# Patient Record
Sex: Female | Born: 1940 | Race: White | Hispanic: No | Marital: Single | Smoking: Never smoker
Health system: Southern US, Community
[De-identification: ages and names within clinical notes are randomized; demographics above are authoritative.]

## PROBLEM LIST (undated history)

## (undated) DIAGNOSIS — E785 Hyperlipidemia, unspecified: Secondary | ICD-10-CM

## (undated) DIAGNOSIS — C801 Malignant (primary) neoplasm, unspecified: Secondary | ICD-10-CM

## (undated) DIAGNOSIS — E039 Hypothyroidism, unspecified: Secondary | ICD-10-CM

## (undated) DIAGNOSIS — M1990 Unspecified osteoarthritis, unspecified site: Secondary | ICD-10-CM

## (undated) DIAGNOSIS — I10 Essential (primary) hypertension: Secondary | ICD-10-CM

## (undated) DIAGNOSIS — R011 Cardiac murmur, unspecified: Secondary | ICD-10-CM

## (undated) HISTORY — DX: Hypothyroidism, unspecified: E03.9

## (undated) HISTORY — PX: VAGINAL HYSTERECTOMY: SUR661

## (undated) HISTORY — DX: Cardiac murmur, unspecified: R01.1

## (undated) HISTORY — DX: Essential (primary) hypertension: I10

## (undated) HISTORY — PX: APPENDECTOMY: SHX54

## (undated) HISTORY — DX: Hyperlipidemia, unspecified: E78.5

## (undated) HISTORY — PX: BREAST SURGERY: SHX581

## (undated) HISTORY — DX: Malignant (primary) neoplasm, unspecified: C80.1

## (undated) HISTORY — DX: Unspecified osteoarthritis, unspecified site: M19.90

## (undated) HISTORY — PX: JOINT REPLACEMENT: SHX530

---

## 2007-02-15 HISTORY — PX: MASTECTOMY, RADICAL: SHX710

## 2010-08-15 HISTORY — PX: COLONOSCOPY: SHX174

## 2011-02-21 DIAGNOSIS — I10 Essential (primary) hypertension: Secondary | ICD-10-CM | POA: Diagnosis not present

## 2011-05-02 DIAGNOSIS — C50319 Malignant neoplasm of lower-inner quadrant of unspecified female breast: Secondary | ICD-10-CM | POA: Diagnosis not present

## 2011-05-02 DIAGNOSIS — C50919 Malignant neoplasm of unspecified site of unspecified female breast: Secondary | ICD-10-CM | POA: Diagnosis not present

## 2011-05-09 DIAGNOSIS — L919 Hypertrophic disorder of the skin, unspecified: Secondary | ICD-10-CM | POA: Diagnosis not present

## 2011-05-09 DIAGNOSIS — L821 Other seborrheic keratosis: Secondary | ICD-10-CM | POA: Diagnosis not present

## 2011-09-05 DIAGNOSIS — C50919 Malignant neoplasm of unspecified site of unspecified female breast: Secondary | ICD-10-CM | POA: Diagnosis not present

## 2011-09-05 DIAGNOSIS — C50319 Malignant neoplasm of lower-inner quadrant of unspecified female breast: Secondary | ICD-10-CM | POA: Diagnosis not present

## 2011-12-05 DIAGNOSIS — Z0000 Encounter for general adult medical examination without abnormal findings: Secondary | ICD-10-CM | POA: Diagnosis not present

## 2011-12-05 DIAGNOSIS — E785 Hyperlipidemia, unspecified: Secondary | ICD-10-CM | POA: Diagnosis not present

## 2011-12-05 DIAGNOSIS — I10 Essential (primary) hypertension: Secondary | ICD-10-CM | POA: Diagnosis not present

## 2011-12-05 DIAGNOSIS — E039 Hypothyroidism, unspecified: Secondary | ICD-10-CM | POA: Diagnosis not present

## 2012-02-20 DIAGNOSIS — Z853 Personal history of malignant neoplasm of breast: Secondary | ICD-10-CM | POA: Diagnosis not present

## 2012-02-20 DIAGNOSIS — Z1231 Encounter for screening mammogram for malignant neoplasm of breast: Secondary | ICD-10-CM | POA: Diagnosis not present

## 2012-02-27 DIAGNOSIS — H52229 Regular astigmatism, unspecified eye: Secondary | ICD-10-CM | POA: Diagnosis not present

## 2012-02-27 DIAGNOSIS — H2510 Age-related nuclear cataract, unspecified eye: Secondary | ICD-10-CM | POA: Diagnosis not present

## 2012-02-27 DIAGNOSIS — H524 Presbyopia: Secondary | ICD-10-CM | POA: Diagnosis not present

## 2012-02-27 DIAGNOSIS — H5210 Myopia, unspecified eye: Secondary | ICD-10-CM | POA: Diagnosis not present

## 2012-04-23 DIAGNOSIS — C50319 Malignant neoplasm of lower-inner quadrant of unspecified female breast: Secondary | ICD-10-CM | POA: Diagnosis not present

## 2012-05-07 DIAGNOSIS — G4700 Insomnia, unspecified: Secondary | ICD-10-CM | POA: Diagnosis not present

## 2012-05-07 DIAGNOSIS — E785 Hyperlipidemia, unspecified: Secondary | ICD-10-CM | POA: Diagnosis not present

## 2012-06-19 DIAGNOSIS — L819 Disorder of pigmentation, unspecified: Secondary | ICD-10-CM | POA: Diagnosis not present

## 2012-06-19 DIAGNOSIS — D235 Other benign neoplasm of skin of trunk: Secondary | ICD-10-CM | POA: Diagnosis not present

## 2012-06-19 DIAGNOSIS — L578 Other skin changes due to chronic exposure to nonionizing radiation: Secondary | ICD-10-CM | POA: Diagnosis not present

## 2012-06-19 DIAGNOSIS — L821 Other seborrheic keratosis: Secondary | ICD-10-CM | POA: Diagnosis not present

## 2012-09-24 DIAGNOSIS — R1013 Epigastric pain: Secondary | ICD-10-CM | POA: Diagnosis not present

## 2012-10-01 DIAGNOSIS — K7689 Other specified diseases of liver: Secondary | ICD-10-CM | POA: Diagnosis not present

## 2012-10-01 DIAGNOSIS — R1013 Epigastric pain: Secondary | ICD-10-CM | POA: Diagnosis not present

## 2012-10-13 DIAGNOSIS — M25579 Pain in unspecified ankle and joints of unspecified foot: Secondary | ICD-10-CM | POA: Diagnosis not present

## 2012-11-19 DIAGNOSIS — C50919 Malignant neoplasm of unspecified site of unspecified female breast: Secondary | ICD-10-CM | POA: Diagnosis not present

## 2012-11-19 DIAGNOSIS — C50319 Malignant neoplasm of lower-inner quadrant of unspecified female breast: Secondary | ICD-10-CM | POA: Diagnosis not present

## 2012-12-10 DIAGNOSIS — I43 Cardiomyopathy in diseases classified elsewhere: Secondary | ICD-10-CM | POA: Diagnosis not present

## 2012-12-10 DIAGNOSIS — Z23 Encounter for immunization: Secondary | ICD-10-CM | POA: Diagnosis not present

## 2012-12-10 DIAGNOSIS — E039 Hypothyroidism, unspecified: Secondary | ICD-10-CM | POA: Diagnosis not present

## 2012-12-10 DIAGNOSIS — I10 Essential (primary) hypertension: Secondary | ICD-10-CM | POA: Diagnosis not present

## 2012-12-10 DIAGNOSIS — Z0000 Encounter for general adult medical examination without abnormal findings: Secondary | ICD-10-CM | POA: Diagnosis not present

## 2012-12-10 DIAGNOSIS — E785 Hyperlipidemia, unspecified: Secondary | ICD-10-CM | POA: Diagnosis not present

## 2013-04-15 DIAGNOSIS — Z1231 Encounter for screening mammogram for malignant neoplasm of breast: Secondary | ICD-10-CM | POA: Diagnosis not present

## 2013-04-15 DIAGNOSIS — Z853 Personal history of malignant neoplasm of breast: Secondary | ICD-10-CM | POA: Diagnosis not present

## 2013-05-06 DIAGNOSIS — H5210 Myopia, unspecified eye: Secondary | ICD-10-CM | POA: Diagnosis not present

## 2013-05-06 DIAGNOSIS — H52229 Regular astigmatism, unspecified eye: Secondary | ICD-10-CM | POA: Diagnosis not present

## 2013-05-06 DIAGNOSIS — H2510 Age-related nuclear cataract, unspecified eye: Secondary | ICD-10-CM | POA: Diagnosis not present

## 2013-05-06 DIAGNOSIS — H524 Presbyopia: Secondary | ICD-10-CM | POA: Diagnosis not present

## 2013-05-20 DIAGNOSIS — C50419 Malignant neoplasm of upper-outer quadrant of unspecified female breast: Secondary | ICD-10-CM | POA: Diagnosis not present

## 2013-05-20 DIAGNOSIS — C50319 Malignant neoplasm of lower-inner quadrant of unspecified female breast: Secondary | ICD-10-CM | POA: Diagnosis not present

## 2013-05-20 DIAGNOSIS — C50919 Malignant neoplasm of unspecified site of unspecified female breast: Secondary | ICD-10-CM | POA: Diagnosis not present

## 2013-05-27 DIAGNOSIS — Z23 Encounter for immunization: Secondary | ICD-10-CM | POA: Diagnosis not present

## 2013-07-29 DIAGNOSIS — I10 Essential (primary) hypertension: Secondary | ICD-10-CM | POA: Diagnosis not present

## 2013-07-29 DIAGNOSIS — H8309 Labyrinthitis, unspecified ear: Secondary | ICD-10-CM | POA: Diagnosis not present

## 2013-12-23 DIAGNOSIS — E784 Other hyperlipidemia: Secondary | ICD-10-CM | POA: Diagnosis not present

## 2013-12-23 DIAGNOSIS — I10 Essential (primary) hypertension: Secondary | ICD-10-CM | POA: Diagnosis not present

## 2013-12-23 DIAGNOSIS — Z0000 Encounter for general adult medical examination without abnormal findings: Secondary | ICD-10-CM | POA: Diagnosis not present

## 2013-12-23 DIAGNOSIS — C50919 Malignant neoplasm of unspecified site of unspecified female breast: Secondary | ICD-10-CM | POA: Diagnosis not present

## 2013-12-23 DIAGNOSIS — Z23 Encounter for immunization: Secondary | ICD-10-CM | POA: Diagnosis not present

## 2013-12-23 DIAGNOSIS — G4700 Insomnia, unspecified: Secondary | ICD-10-CM | POA: Diagnosis not present

## 2013-12-23 DIAGNOSIS — I43 Cardiomyopathy in diseases classified elsewhere: Secondary | ICD-10-CM | POA: Diagnosis not present

## 2013-12-23 DIAGNOSIS — E039 Hypothyroidism, unspecified: Secondary | ICD-10-CM | POA: Diagnosis not present

## 2014-02-14 HISTORY — PX: SHOULDER SURGERY: SHX246

## 2014-04-15 HISTORY — PX: BREAST EXCISIONAL BIOPSY: SUR124

## 2014-04-28 DIAGNOSIS — Z1231 Encounter for screening mammogram for malignant neoplasm of breast: Secondary | ICD-10-CM | POA: Diagnosis not present

## 2014-04-28 DIAGNOSIS — Z9012 Acquired absence of left breast and nipple: Secondary | ICD-10-CM | POA: Diagnosis not present

## 2014-04-28 DIAGNOSIS — Z853 Personal history of malignant neoplasm of breast: Secondary | ICD-10-CM | POA: Diagnosis not present

## 2014-05-02 DIAGNOSIS — N63 Unspecified lump in breast: Secondary | ICD-10-CM | POA: Diagnosis not present

## 2014-05-12 DIAGNOSIS — R928 Other abnormal and inconclusive findings on diagnostic imaging of breast: Secondary | ICD-10-CM | POA: Diagnosis not present

## 2014-05-12 DIAGNOSIS — L03317 Cellulitis of buttock: Secondary | ICD-10-CM | POA: Diagnosis not present

## 2014-05-12 DIAGNOSIS — I10 Essential (primary) hypertension: Secondary | ICD-10-CM | POA: Diagnosis not present

## 2014-05-13 DIAGNOSIS — D4861 Neoplasm of uncertain behavior of right breast: Secondary | ICD-10-CM | POA: Diagnosis not present

## 2014-05-13 DIAGNOSIS — N63 Unspecified lump in breast: Secondary | ICD-10-CM | POA: Diagnosis not present

## 2014-05-13 DIAGNOSIS — Z853 Personal history of malignant neoplasm of breast: Secondary | ICD-10-CM | POA: Diagnosis not present

## 2014-05-13 DIAGNOSIS — D241 Benign neoplasm of right breast: Secondary | ICD-10-CM | POA: Diagnosis not present

## 2014-06-04 DIAGNOSIS — L918 Other hypertrophic disorders of the skin: Secondary | ICD-10-CM | POA: Diagnosis not present

## 2014-06-23 DIAGNOSIS — Z853 Personal history of malignant neoplasm of breast: Secondary | ICD-10-CM | POA: Diagnosis not present

## 2014-06-23 DIAGNOSIS — E039 Hypothyroidism, unspecified: Secondary | ICD-10-CM | POA: Diagnosis not present

## 2014-06-23 DIAGNOSIS — D241 Benign neoplasm of right breast: Secondary | ICD-10-CM | POA: Diagnosis not present

## 2014-06-23 DIAGNOSIS — K5120 Ulcerative (chronic) proctitis without complications: Secondary | ICD-10-CM | POA: Diagnosis not present

## 2014-06-23 DIAGNOSIS — N6091 Unspecified benign mammary dysplasia of right breast: Secondary | ICD-10-CM | POA: Diagnosis not present

## 2014-06-23 DIAGNOSIS — N649 Disorder of breast, unspecified: Secondary | ICD-10-CM | POA: Diagnosis not present

## 2014-06-23 DIAGNOSIS — N6489 Other specified disorders of breast: Secondary | ICD-10-CM | POA: Diagnosis not present

## 2014-06-23 DIAGNOSIS — R928 Other abnormal and inconclusive findings on diagnostic imaging of breast: Secondary | ICD-10-CM | POA: Diagnosis not present

## 2014-06-23 DIAGNOSIS — I10 Essential (primary) hypertension: Secondary | ICD-10-CM | POA: Diagnosis not present

## 2014-06-23 DIAGNOSIS — Z79899 Other long term (current) drug therapy: Secondary | ICD-10-CM | POA: Diagnosis not present

## 2014-06-25 DIAGNOSIS — D225 Melanocytic nevi of trunk: Secondary | ICD-10-CM | POA: Diagnosis not present

## 2014-06-25 DIAGNOSIS — L821 Other seborrheic keratosis: Secondary | ICD-10-CM | POA: Diagnosis not present

## 2014-06-25 DIAGNOSIS — D2372 Other benign neoplasm of skin of left lower limb, including hip: Secondary | ICD-10-CM | POA: Diagnosis not present

## 2014-06-25 DIAGNOSIS — L570 Actinic keratosis: Secondary | ICD-10-CM | POA: Diagnosis not present

## 2014-06-25 DIAGNOSIS — L578 Other skin changes due to chronic exposure to nonionizing radiation: Secondary | ICD-10-CM | POA: Diagnosis not present

## 2014-06-27 DIAGNOSIS — Z853 Personal history of malignant neoplasm of breast: Secondary | ICD-10-CM | POA: Insufficient documentation

## 2014-06-27 DIAGNOSIS — E039 Hypothyroidism, unspecified: Secondary | ICD-10-CM | POA: Insufficient documentation

## 2014-06-30 DIAGNOSIS — Z800 Family history of malignant neoplasm of digestive organs: Secondary | ICD-10-CM | POA: Diagnosis not present

## 2014-06-30 DIAGNOSIS — R140 Abdominal distension (gaseous): Secondary | ICD-10-CM | POA: Diagnosis not present

## 2014-07-02 DIAGNOSIS — L918 Other hypertrophic disorders of the skin: Secondary | ICD-10-CM | POA: Diagnosis not present

## 2014-08-05 DIAGNOSIS — M25512 Pain in left shoulder: Secondary | ICD-10-CM | POA: Diagnosis not present

## 2014-08-26 ENCOUNTER — Other Ambulatory Visit: Payer: Self-pay | Admitting: Internal Medicine

## 2014-09-01 DIAGNOSIS — M75112 Incomplete rotator cuff tear or rupture of left shoulder, not specified as traumatic: Secondary | ICD-10-CM | POA: Diagnosis not present

## 2014-09-01 DIAGNOSIS — M25512 Pain in left shoulder: Secondary | ICD-10-CM | POA: Diagnosis not present

## 2014-09-08 ENCOUNTER — Other Ambulatory Visit: Payer: Self-pay | Admitting: Internal Medicine

## 2014-09-09 DIAGNOSIS — M75112 Incomplete rotator cuff tear or rupture of left shoulder, not specified as traumatic: Secondary | ICD-10-CM | POA: Diagnosis not present

## 2014-09-09 DIAGNOSIS — M25512 Pain in left shoulder: Secondary | ICD-10-CM | POA: Diagnosis not present

## 2014-09-30 ENCOUNTER — Other Ambulatory Visit: Payer: Self-pay | Admitting: Internal Medicine

## 2014-10-07 DIAGNOSIS — M25512 Pain in left shoulder: Secondary | ICD-10-CM | POA: Diagnosis not present

## 2014-10-21 DIAGNOSIS — M8700 Idiopathic aseptic necrosis of unspecified bone: Secondary | ICD-10-CM | POA: Diagnosis not present

## 2014-10-27 DIAGNOSIS — Z853 Personal history of malignant neoplasm of breast: Secondary | ICD-10-CM | POA: Diagnosis not present

## 2014-10-27 DIAGNOSIS — M8700 Idiopathic aseptic necrosis of unspecified bone: Secondary | ICD-10-CM | POA: Diagnosis not present

## 2014-10-27 DIAGNOSIS — M25512 Pain in left shoulder: Secondary | ICD-10-CM | POA: Diagnosis not present

## 2014-10-29 ENCOUNTER — Other Ambulatory Visit: Payer: Self-pay | Admitting: Internal Medicine

## 2014-10-30 ENCOUNTER — Other Ambulatory Visit: Payer: Self-pay | Admitting: Internal Medicine

## 2014-10-30 ENCOUNTER — Encounter: Payer: Self-pay | Admitting: Internal Medicine

## 2014-10-30 ENCOUNTER — Other Ambulatory Visit: Payer: Self-pay

## 2014-10-30 MED ORDER — ZOLPIDEM TARTRATE 10 MG PO TABS: 10.0000 mg | ORAL_TABLET | Freq: Every day | ORAL | Status: DC

## 2014-11-02 ENCOUNTER — Other Ambulatory Visit: Payer: Self-pay | Admitting: Internal Medicine

## 2014-11-03 ENCOUNTER — Other Ambulatory Visit: Payer: Self-pay | Admitting: Internal Medicine

## 2014-11-03 DIAGNOSIS — H524 Presbyopia: Secondary | ICD-10-CM | POA: Diagnosis not present

## 2014-11-03 DIAGNOSIS — H2513 Age-related nuclear cataract, bilateral: Secondary | ICD-10-CM | POA: Diagnosis not present

## 2014-11-03 DIAGNOSIS — H5213 Myopia, bilateral: Secondary | ICD-10-CM | POA: Diagnosis not present

## 2014-11-03 DIAGNOSIS — H52223 Regular astigmatism, bilateral: Secondary | ICD-10-CM | POA: Diagnosis not present

## 2014-11-03 MED ORDER — ZOLPIDEM TARTRATE 10 MG PO TABS: 10.0000 mg | ORAL_TABLET | Freq: Every day | ORAL | Status: DC

## 2014-11-04 DIAGNOSIS — M8700 Idiopathic aseptic necrosis of unspecified bone: Secondary | ICD-10-CM | POA: Diagnosis not present

## 2014-11-12 DIAGNOSIS — M8700 Idiopathic aseptic necrosis of unspecified bone: Secondary | ICD-10-CM | POA: Diagnosis not present

## 2014-11-26 DIAGNOSIS — M75112 Incomplete rotator cuff tear or rupture of left shoulder, not specified as traumatic: Secondary | ICD-10-CM | POA: Diagnosis not present

## 2014-11-26 DIAGNOSIS — M7542 Impingement syndrome of left shoulder: Secondary | ICD-10-CM | POA: Diagnosis not present

## 2014-11-26 DIAGNOSIS — M87822 Other osteonecrosis, left humerus: Secondary | ICD-10-CM | POA: Diagnosis not present

## 2014-11-26 DIAGNOSIS — M7502 Adhesive capsulitis of left shoulder: Secondary | ICD-10-CM | POA: Diagnosis not present

## 2014-11-26 DIAGNOSIS — M89722 Major osseous defect, left humerus: Secondary | ICD-10-CM | POA: Diagnosis not present

## 2014-11-26 DIAGNOSIS — Z853 Personal history of malignant neoplasm of breast: Secondary | ICD-10-CM | POA: Diagnosis not present

## 2014-11-26 DIAGNOSIS — M19012 Primary osteoarthritis, left shoulder: Secondary | ICD-10-CM | POA: Diagnosis not present

## 2014-11-26 DIAGNOSIS — M75102 Unspecified rotator cuff tear or rupture of left shoulder, not specified as traumatic: Secondary | ICD-10-CM | POA: Diagnosis not present

## 2014-11-26 DIAGNOSIS — M7552 Bursitis of left shoulder: Secondary | ICD-10-CM | POA: Diagnosis not present

## 2014-11-26 DIAGNOSIS — M87022 Idiopathic aseptic necrosis of left humerus: Secondary | ICD-10-CM | POA: Diagnosis not present

## 2014-12-02 DIAGNOSIS — G8929 Other chronic pain: Secondary | ICD-10-CM | POA: Diagnosis not present

## 2014-12-02 DIAGNOSIS — Z4889 Encounter for other specified surgical aftercare: Secondary | ICD-10-CM | POA: Diagnosis not present

## 2014-12-02 DIAGNOSIS — M25511 Pain in right shoulder: Secondary | ICD-10-CM | POA: Diagnosis not present

## 2014-12-02 DIAGNOSIS — Z4789 Encounter for other orthopedic aftercare: Secondary | ICD-10-CM | POA: Diagnosis not present

## 2014-12-03 ENCOUNTER — Encounter: Payer: Self-pay | Admitting: Internal Medicine

## 2014-12-03 DIAGNOSIS — R928 Other abnormal and inconclusive findings on diagnostic imaging of breast: Secondary | ICD-10-CM | POA: Insufficient documentation

## 2014-12-03 DIAGNOSIS — I10 Essential (primary) hypertension: Secondary | ICD-10-CM | POA: Insufficient documentation

## 2014-12-03 DIAGNOSIS — M1711 Unilateral primary osteoarthritis, right knee: Secondary | ICD-10-CM | POA: Insufficient documentation

## 2014-12-03 DIAGNOSIS — Z853 Personal history of malignant neoplasm of breast: Secondary | ICD-10-CM | POA: Insufficient documentation

## 2014-12-03 DIAGNOSIS — F5101 Primary insomnia: Secondary | ICD-10-CM | POA: Insufficient documentation

## 2014-12-03 DIAGNOSIS — E559 Vitamin D deficiency, unspecified: Secondary | ICD-10-CM | POA: Insufficient documentation

## 2014-12-03 DIAGNOSIS — E039 Hypothyroidism, unspecified: Secondary | ICD-10-CM | POA: Insufficient documentation

## 2014-12-03 DIAGNOSIS — K5010 Crohn's disease of large intestine without complications: Secondary | ICD-10-CM | POA: Insufficient documentation

## 2014-12-03 DIAGNOSIS — E785 Hyperlipidemia, unspecified: Secondary | ICD-10-CM | POA: Insufficient documentation

## 2014-12-03 DIAGNOSIS — L719 Rosacea, unspecified: Secondary | ICD-10-CM | POA: Insufficient documentation

## 2014-12-08 DIAGNOSIS — G8929 Other chronic pain: Secondary | ICD-10-CM | POA: Diagnosis not present

## 2014-12-08 DIAGNOSIS — M25511 Pain in right shoulder: Secondary | ICD-10-CM | POA: Diagnosis not present

## 2014-12-08 DIAGNOSIS — Z4889 Encounter for other specified surgical aftercare: Secondary | ICD-10-CM | POA: Diagnosis not present

## 2014-12-08 DIAGNOSIS — Z4789 Encounter for other orthopedic aftercare: Secondary | ICD-10-CM | POA: Diagnosis not present

## 2014-12-15 ENCOUNTER — Encounter: Payer: Self-pay | Admitting: Internal Medicine

## 2014-12-15 ENCOUNTER — Ambulatory Visit (INDEPENDENT_AMBULATORY_CARE_PROVIDER_SITE_OTHER): Payer: Medicare Other | Admitting: Internal Medicine

## 2014-12-15 VITALS — BP 110/68 | HR 60 | Ht 64.5 in | Wt 171.2 lb

## 2014-12-15 DIAGNOSIS — I10 Essential (primary) hypertension: Secondary | ICD-10-CM | POA: Diagnosis not present

## 2014-12-15 DIAGNOSIS — M8700 Idiopathic aseptic necrosis of unspecified bone: Secondary | ICD-10-CM

## 2014-12-15 DIAGNOSIS — B009 Herpesviral infection, unspecified: Secondary | ICD-10-CM | POA: Insufficient documentation

## 2014-12-15 DIAGNOSIS — Z23 Encounter for immunization: Secondary | ICD-10-CM

## 2014-12-15 DIAGNOSIS — M25511 Pain in right shoulder: Secondary | ICD-10-CM | POA: Diagnosis not present

## 2014-12-15 DIAGNOSIS — Z4889 Encounter for other specified surgical aftercare: Secondary | ICD-10-CM | POA: Diagnosis not present

## 2014-12-15 DIAGNOSIS — G8929 Other chronic pain: Secondary | ICD-10-CM | POA: Diagnosis not present

## 2014-12-15 DIAGNOSIS — Z4789 Encounter for other orthopedic aftercare: Secondary | ICD-10-CM | POA: Diagnosis not present

## 2014-12-15 MED ORDER — VALACYCLOVIR HCL 1 G PO TABS: 1000.0000 mg | ORAL_TABLET | Freq: Two times a day (BID) | ORAL | Status: DC

## 2014-12-15 NOTE — Progress Notes (Signed)
Date:  12/15/2014   Name:  Belinda Sanchez   DOB:  01-02-1941   MRN:  [id number]   Chief Complaint: Rash and Hypertension Rash This is a recurrent problem. The affected locations include the right buttock. The rash is characterized by blistering, itchiness, redness and burning. Pertinent negatives include no cough, fever or shortness of breath. Past treatments include nothing. (No history of herpes infection. The rash occurs in the same spot usually last 7-10 days.)  Hypertension This is a chronic problem. The current episode started more than 1 year ago. The problem is controlled. Pertinent negatives include no chest pain, headaches, palpitations or shortness of breath. There are no associated agents to hypertension. Past treatments include calcium channel blockers. The current treatment provides significant improvement. There are no compliance problems.    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 She is doing better no longer taking narcotic pain medication. She is undergoing physical therapy.  Review of Systems  Constitutional: Negative for fever and chills.  Respiratory: Negative for cough, chest tightness and shortness of breath.   Cardiovascular: Negative for chest pain, palpitations and leg swelling.  Genitourinary: Negative for dysuria and genital sores.  Musculoskeletal: Positive for arthralgias (Recent left shoulder surgery).  Skin: Positive for rash.  Neurological: Negative for weakness, numbness and headaches.  Hematological: Negative for adenopathy.  Psychiatric/Behavioral: Negative for dysphoric mood. The patient is not nervous/anxious.     Patient Active Problem List   Diagnosis Date Noted  . Abnormal finding on mammography 12/03/2014  . Acne erythematosa 12/03/2014  . Acquired hypothyroidism 12/03/2014  . CC (Crohn's colitis) ([place]) 12/03/2014  . Dyslipidemia 12/03/2014  . Essential (primary) hypertension 12/03/2014  .  Idiopathic insomnia 12/03/2014  . Breast CA ([place]) 12/03/2014  . 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 Avitaminosis D 12/03/2014  . 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 Authorizing Provider  BYSTOLIC 10 MG tablet TAKE ONE TABLET BY MOUTH EVERY DAY 11/02/14  Yes Glean Hess, MD  diltiazem (DILACOR XR) 240 MG 24 hr capsule TAKE ONE CAPSULE BY MOUTH DAILY 10/01/14  Yes Glean Hess, MD  Flaxseed, Linseed, (FLAXSEED OIL) 1000 MG CAPS Take by mouth.   Yes Historical Provider, MD  levothyroxine (SYNTHROID) 150 MCG tablet Take by mouth. 02/12/14  Yes Historical Provider, MD  losartan (COZAAR) 50 MG tablet TAKE 1 TABLET BY MOUTH EVERY DAY 08/27/14  Yes Glean Hess, MD  Multiple Vitamins tablet Take by mouth.   Yes Historical Provider, MD  Omega-3 Fatty Acids (FISH OIL) 1000 MG CPDR Take by mouth.   Yes Historical Provider, MD  RA KRILL OIL 500 MG CAPS Take by mouth.   Yes Historical Provider, MD  zolpidem (AMBIEN) 10 MG tablet Take 1 tablet (10 mg total) by mouth at bedtime. 11/03/14  Yes Glean Hess, MD    Allergies  Allergen Reactions  . Lozol  [Indapamide]     elevated serum calcium  . Loratadine Palpitations    Past Surgical History  Procedure Laterality Date  . Mastectomy, radical Left 2009  . Vaginal hysterectomy      total  . Appendectomy    . Shoulder surgery  2016    AVN    Social History  Substance Use Topics  . Smoking status: Never Smoker   . Smokeless tobacco: None  . Alcohol Use: 1.2 oz/week    2 Standard drinks  or equivalent per week    Medication list has been reviewed and updated.   Physical Exam  Constitutional: She is oriented to person, place, and time. She appears well-developed. No distress.  HENT:  Head: Normocephalic and atraumatic.  Eyes: Conjunctivae are normal. Right eye exhibits no discharge. Left eye exhibits no discharge. No scleral icterus.    Cardiovascular: Normal rate, regular rhythm and normal heart sounds.   Pulmonary/Chest: Effort normal and breath sounds normal. No respiratory distress.  Musculoskeletal: Normal range of motion.  Neurological: She is alert and oriented to person, place, and time.  Skin: Skin is warm and dry. No rash noted.     Psychiatric: She has a normal mood and affect. Her behavior is normal. Thought content normal.    BP 110/68 mmHg  Pulse 60  Ht 5' 4.5" (1.638 m)  Wt 171 lb 3.2 oz (77.656 kg)  BMI 28.94 kg/m2  Assessment and Plan: 1. Flu vaccine need - Flu Vaccine QUAD 36+ mos PF IM (Fluarix & Fluzone Quad PF)  2. Avascular necrosis of bone (HCC) Status post shoulder surgery doing well  3. Essential (primary) hypertension Controlled on current regimen  4. 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

## 2014-12-23 DIAGNOSIS — Z4789 Encounter for other orthopedic aftercare: Secondary | ICD-10-CM | POA: Diagnosis not present

## 2014-12-23 DIAGNOSIS — M25511 Pain in right shoulder: Secondary | ICD-10-CM | POA: Diagnosis not present

## 2014-12-23 DIAGNOSIS — Z4889 Encounter for other specified surgical aftercare: Secondary | ICD-10-CM | POA: Diagnosis not present

## 2014-12-23 DIAGNOSIS — G8929 Other chronic pain: Secondary | ICD-10-CM | POA: Diagnosis not present

## 2015-01-01 DIAGNOSIS — Z4889 Encounter for other specified surgical aftercare: Secondary | ICD-10-CM | POA: Diagnosis not present

## 2015-01-01 DIAGNOSIS — Z4789 Encounter for other orthopedic aftercare: Secondary | ICD-10-CM | POA: Diagnosis not present

## 2015-01-01 DIAGNOSIS — M25511 Pain in right shoulder: Secondary | ICD-10-CM | POA: Diagnosis not present

## 2015-01-01 DIAGNOSIS — G8929 Other chronic pain: Secondary | ICD-10-CM | POA: Diagnosis not present

## 2015-01-12 DIAGNOSIS — L821 Other seborrheic keratosis: Secondary | ICD-10-CM | POA: Diagnosis not present

## 2015-01-13 DIAGNOSIS — Z4889 Encounter for other specified surgical aftercare: Secondary | ICD-10-CM | POA: Diagnosis not present

## 2015-01-13 DIAGNOSIS — M25511 Pain in right shoulder: Secondary | ICD-10-CM | POA: Diagnosis not present

## 2015-01-13 DIAGNOSIS — Z4789 Encounter for other orthopedic aftercare: Secondary | ICD-10-CM | POA: Diagnosis not present

## 2015-01-13 DIAGNOSIS — G8929 Other chronic pain: Secondary | ICD-10-CM | POA: Diagnosis not present

## 2015-01-15 ENCOUNTER — Other Ambulatory Visit: Payer: Self-pay | Admitting: Internal Medicine

## 2015-01-27 DIAGNOSIS — M25511 Pain in right shoulder: Secondary | ICD-10-CM | POA: Diagnosis not present

## 2015-01-27 DIAGNOSIS — G8929 Other chronic pain: Secondary | ICD-10-CM | POA: Diagnosis not present

## 2015-01-27 DIAGNOSIS — Z4789 Encounter for other orthopedic aftercare: Secondary | ICD-10-CM | POA: Diagnosis not present

## 2015-01-27 DIAGNOSIS — Z4889 Encounter for other specified surgical aftercare: Secondary | ICD-10-CM | POA: Diagnosis not present

## 2015-02-02 DIAGNOSIS — M25511 Pain in right shoulder: Secondary | ICD-10-CM | POA: Diagnosis not present

## 2015-02-02 DIAGNOSIS — Z4789 Encounter for other orthopedic aftercare: Secondary | ICD-10-CM | POA: Diagnosis not present

## 2015-02-02 DIAGNOSIS — Z4889 Encounter for other specified surgical aftercare: Secondary | ICD-10-CM | POA: Diagnosis not present

## 2015-02-02 DIAGNOSIS — G8929 Other chronic pain: Secondary | ICD-10-CM | POA: Diagnosis not present

## 2015-03-02 ENCOUNTER — Encounter: Payer: Self-pay | Admitting: Internal Medicine

## 2015-03-02 ENCOUNTER — Ambulatory Visit (INDEPENDENT_AMBULATORY_CARE_PROVIDER_SITE_OTHER): Payer: Medicare Other | Admitting: Internal Medicine

## 2015-03-02 VITALS — BP 138/88 | HR 52 | Ht 64.5 in | Wt 174.6 lb

## 2015-03-02 DIAGNOSIS — Z0000 Encounter for general adult medical examination without abnormal findings: Secondary | ICD-10-CM

## 2015-03-02 DIAGNOSIS — Z853 Personal history of malignant neoplasm of breast: Secondary | ICD-10-CM

## 2015-03-02 DIAGNOSIS — E785 Hyperlipidemia, unspecified: Secondary | ICD-10-CM

## 2015-03-02 DIAGNOSIS — I10 Essential (primary) hypertension: Secondary | ICD-10-CM | POA: Diagnosis not present

## 2015-03-02 DIAGNOSIS — K5010 Crohn's disease of large intestine without complications: Secondary | ICD-10-CM

## 2015-03-02 DIAGNOSIS — E039 Hypothyroidism, unspecified: Secondary | ICD-10-CM | POA: Diagnosis not present

## 2015-03-02 LAB — POCT URINALYSIS DIPSTICK
Bilirubin, UA: NEGATIVE
Blood, UA: NEGATIVE
Glucose, UA: NEGATIVE
KETONES UA: NEGATIVE
LEUKOCYTES UA: NEGATIVE
NITRITE UA: NEGATIVE
PH UA: 7.5
PROTEIN UA: NEGATIVE
Spec Grav, UA: 1.01
UROBILINOGEN UA: 0.2

## 2015-03-02 NOTE — Progress Notes (Signed)
Patient: Belinda Sanchez, Female    DOB: 1940/08/20, 75 y.o.   MRN: [id number] Visit Date: 03/02/2015  Today's Provider: Halina Maidens, MD   Chief Complaint  Patient presents with  . Medicare Wellness   Subjective:    Annual wellness visit Belinda Sanchez is a 75 y.o. female who presents today for her Subsequent Annual Wellness Visit. She feels well. She reports exercising walking. She reports she is sleeping well using ambien.  Last mammogram was 04/2014 followed by right breast mass excision which was benign.  She is unsure her next follow up. She denies any breast problems. She is recovering well from left shoulder replacement for AVN. ----------------------------------------------------------- Hypertension This is a chronic problem. The current episode started more than 1 year ago. The problem is unchanged. The problem is controlled. Pertinent negatives include no chest pain, headaches or palpitations. Past treatments include beta blockers, calcium channel blockers and angiotensin blockers. The current treatment provides significant improvement. There are no compliance problems.  Hypertensive end-organ damage includes a thyroid problem.  Thyroid Problem Presents for follow-up visit. Patient reports no anxiety, cold intolerance, constipation, depressed mood, diarrhea, fatigue or palpitations. The symptoms have been stable. Past treatments include levothyroxine.    Review of Systems  Constitutional: Negative for fever, chills and fatigue.  HENT: Negative for hearing loss, tinnitus, trouble swallowing and voice change.   Eyes: Negative for visual disturbance.  Cardiovascular: Negative for chest pain, palpitations and leg swelling.  Gastrointestinal: Negative for nausea, abdominal pain, diarrhea and constipation.  Endocrine: Negative for cold intolerance, polydipsia and polyuria.  Genitourinary: Negative for urgency, hematuria and pelvic pain.  Musculoskeletal: Positive for arthralgias  (recovering well from shoulder surgery).  Skin: Negative for color change and rash.  Neurological: Positive for dizziness (occasional vertigo when lying in bed). Negative for syncope, numbness and headaches.  Hematological: Negative for adenopathy. Does not bruise/bleed easily.  Psychiatric/Behavioral: Positive for sleep disturbance (controlled with ambien). Negative for confusion and dysphoric mood. The patient is not nervous/anxious.     Social History   Social History  . Marital Status: Unknown    Spouse Name: N/A  . Number of Children: N/A  . Years of Education: N/A   Occupational History  . receptionist    Social History Main Topics  . Smoking status: Never Smoker   . Smokeless tobacco: Not on file  . Alcohol Use: 1.2 oz/week    2 Standard drinks or equivalent per week     Comment: occasional  . Drug Use: No  . Sexual Activity: Not on file   Other Topics Concern  . Not on file   Social History Narrative    Patient Active Problem List   Diagnosis Date Noted  . Herpes simplex infection 12/15/2014  . Abnormal finding on mammography 12/03/2014  . Acne erythematosa 12/03/2014  . Acquired hypothyroidism 12/03/2014  . CC (Crohn's colitis) ([place]) 12/03/2014  . Dyslipidemia 12/03/2014  . Essential (primary) hypertension 12/03/2014  . Idiopathic insomnia 12/03/2014  . Hx of breast cancer 12/03/2014  . 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 Avitaminosis D 12/03/2014  . Avascular necrosis of bone ([place]) 10/21/2014    Past Surgical History  Procedure Laterality Date  . Mastectomy, radical Left 2009  . Vaginal hysterectomy      total  . Appendectomy    . Shoulder surgery  2016    AVN  . Breast excisional biopsy Right 04/2014    benign  . Colonoscopy  08/2010    one polyp - repeat 5 yrs  Her family history includes Diabetes in her mother; Hypertension in her mother.    Previous Medications   BYSTOLIC 10 MG TABLET    TAKE ONE TABLET BY MOUTH EVERY DAY   DILTIAZEM (DILACOR  XR) 240 MG 24 HR CAPSULE    TAKE ONE CAPSULE BY MOUTH DAILY   FLAXSEED, LINSEED, (FLAXSEED OIL) 1000 MG CAPS    Take by mouth.   LEVOTHYROXINE (SYNTHROID, LEVOTHROID) 150 MCG TABLET    TAKE 1 TABLET BY MOUTH DAILY *TAKE A HALF TABLET ON MON/FRI*   LOSARTAN (COZAAR) 50 MG TABLET    TAKE 1 TABLET BY MOUTH EVERY DAY   MULTIPLE VITAMINS TABLET    Take by mouth.   OMEGA-3 FATTY ACIDS (FISH OIL) 1000 MG CPDR    Take by mouth.   ZOLPIDEM (AMBIEN) 10 MG TABLET    Take 1 tablet (10 mg total) by mouth at bedtime.    Patient Care Team: Glean Hess, MD as PCP - General (Family Medicine)     Objective:   Vitals: BP 138/88 mmHg  Pulse 52  Ht 5' 4.5" (1.638 m)  Wt 174 lb 9.6 oz (79.198 kg)  BMI 29.52 kg/m2  Physical Exam  Constitutional: She is oriented to person, place, and time. She appears well-developed and well-nourished. No distress.  HENT:  Head: Normocephalic and atraumatic.  Right Ear: Tympanic membrane and ear canal normal.  Left Ear: Tympanic membrane and ear canal normal.  Nose: Right sinus exhibits no maxillary sinus tenderness. Left sinus exhibits no maxillary sinus tenderness.  Mouth/Throat: Uvula is midline and oropharynx is clear and moist.  Eyes: Conjunctivae and EOM are normal. Right eye exhibits no discharge. Left eye exhibits no discharge. No scleral icterus.  Neck: Normal range of motion. Carotid bruit is not present. No erythema present. No thyromegaly present.  Cardiovascular: Normal rate, regular rhythm, normal heart sounds and normal pulses.   Pulmonary/Chest: Effort normal and breath sounds normal. No respiratory distress. She has no wheezes. Right breast exhibits no mass, no nipple discharge, no skin change and no tenderness.  Left mastectomy site - skin intact without adenopathy or skin lesions  Abdominal: Soft. Bowel sounds are normal. There is no hepatosplenomegaly. There is no tenderness. There is no CVA tenderness.  Musculoskeletal: Normal range of motion.    Lymphadenopathy:    She has no cervical adenopathy.    She has no axillary adenopathy.  Neurological: She is alert and oriented to person, place, and time. She has normal reflexes. No cranial nerve deficit or sensory deficit.  Skin: Skin is warm, dry and intact. No rash noted.  Psychiatric: She has a normal mood and affect. Her speech is normal and behavior is normal. Judgment and thought content normal. Cognition and memory are normal.  Nursing note and vitals reviewed.   Activities of Daily Living In your present state of health, do you have any difficulty performing the following activities: 12/15/2014  Hearing? N  Vision? N  Difficulty concentrating or making decisions? N  Walking or climbing stairs? N  Dressing or bathing? N  Doing errands, shopping? N    Fall Risk Assessment Fall Risk  12/15/2014  Falls in the past year? No      Depression Screen PHQ 2/9 Scores 12/15/2014  PHQ - 2 Score 0    Cognitive Testing - 6-CIT   Correct? Score   What year is it? yes 0 Yes = 0    No = 4  What month is it? yes 0 Yes =  0    No = 3  Remember:     [place], [address], [place]     What time is it? yes 0 Yes = 0    No = 3  Count backwards from 20 to 1 yes 0 Correct = 0    1 error = 2   More than 1 error = 4  Say the months of the year in reverse. yes 0 Correct = 0    1 error = 2   More than 1 error = 4  What address did I ask you to remember? yes 0 Correct = 0  1 error = 2    2 error = 4    3 error = 6    4 error = 8    All wrong = 10       TOTAL SCORE  1/28   Interpretation:  Normal  Normal (0-7) Abnormal (8-28)        Medicare Annual Wellness Visit Summary:  Reviewed patient's Family Medical History Reviewed and updated list of patient's medical providers Assessment of cognitive impairment was done Assessed patient's functional ability Established a written schedule for health screening [place] Completed and Reviewed  Exercise  Activities and Dietary recommendations Goals    . Exercise 150 minutes per week (moderate activity)     Get back to exercise that has been on hold since shoulder surgery       Immunization History  Administered Date(s) Administered  . Influenza,inj,Quad PF,36+ Mos 12/15/2014  . Pneumococcal Conjugate-13 12/23/2013  . Pneumococcal Polysaccharide-23 02/16/2007, 12/10/2012  . Tdap 05/27/2013  . Zoster 02/15/2010    Health Maintenance  Topic Date Due  . DEXA SCAN  09/14/2007  . INFLUENZA VACCINE  09/15/2015  . MAMMOGRAM  04/14/2016  . COLONOSCOPY  08/14/2021  . TETANUS/TDAP  05/28/2023  . ZOSTAVAX  Completed  . PNA vac Low Risk Adult  Completed     Discussed health benefits of physical activity, and encouraged her to engage in regular exercise appropriate for her age and condition.    ------------------------------------------------------------------------------------------------------------   Assessment & Plan:  1. Medicare annual wellness visit, subsequent Medicare wellness measure satisfied - POCT urinalysis dipstick  2. Essential (primary) hypertension Well-controlled - CBC with Differential/Platelet - Comprehensive metabolic panel  3. Acquired hypothyroidism Supplemented - TSH  4. Dyslipidemia Treated with flaxseed oil and fish oil Will notify patient if prescription medication is recommended - Lipid panel  5. Hx of breast cancer May resume annual mammograms of the right breast with ultrasound as needed   6. CC (Crohn's colitis), without complications ([place]) Colonoscopy due July of this year She will contact GI specialist to schedule this   Halina Maidens, MD [facility]  03/02/2015

## 2015-03-02 NOTE — Patient Instructions (Signed)
Health Maintenance  Topic Date Due  . DEXA SCAN  09/14/2007  . INFLUENZA VACCINE  09/15/2015  . MAMMOGRAM  04/14/2016  . COLONOSCOPY  08/14/2021  . TETANUS/TDAP  05/28/2023  . ZOSTAVAX  Completed  . PNA vac Low Risk Adult  Completed

## 2015-03-03 ENCOUNTER — Telehealth: Payer: Self-pay

## 2015-03-03 LAB — COMPREHENSIVE METABOLIC PANEL
ALBUMIN: 4.8 g/dL (ref 3.5–4.8)
ALK PHOS: 103 IU/L (ref 39–117)
ALT: 29 IU/L (ref 0–32)
AST: 31 IU/L (ref 0–40)
Albumin/Globulin Ratio: 1.9 (ref 1.1–2.5)
BUN / CREAT RATIO: 22 (ref 11–26)
BUN: 19 mg/dL (ref 8–27)
Bilirubin Total: 0.6 mg/dL (ref 0.0–1.2)
CALCIUM: 9.9 mg/dL (ref 8.7–10.3)
CO2: 26 mmol/L (ref 18–29)
Chloride: 97 mmol/L (ref 96–106)
Creatinine, Ser: 0.88 mg/dL (ref 0.57–1.00)
GFR calc Af Amer: 76 mL/min/{1.73_m2} (ref >59)
GFR, EST NON AFRICAN AMERICAN: 66 mL/min/{1.73_m2} (ref >59)
Globulin, Total: 2.5 g/dL (ref 1.5–4.5)
Glucose: 101 mg/dL — ABNORMAL HIGH (ref 65–99)
Potassium: 4.6 mmol/L (ref 3.5–5.2)
SODIUM: 142 mmol/L (ref 134–144)
Total Protein: 7.3 g/dL (ref 6.0–8.5)

## 2015-03-03 LAB — CBC WITH DIFFERENTIAL/PLATELET
BASOS: 1 %
Basophils Absolute: 0.1 10*3/uL (ref 0.0–0.2)
EOS (ABSOLUTE): 0.2 10*3/uL (ref 0.0–0.4)
EOS: 2 %
HEMATOCRIT: 44.8 % (ref 34.0–46.6)
HEMOGLOBIN: 15.4 g/dL (ref 11.1–15.9)
Immature Grans (Abs): 0 10*3/uL (ref 0.0–0.1)
Immature Granulocytes: 0 %
LYMPHS ABS: 3.3 10*3/uL — AB (ref 0.7–3.1)
Lymphs: 33 %
MCH: 32.2 pg (ref 26.6–33.0)
MCHC: 34.4 g/dL (ref 31.5–35.7)
MCV: 94 fL (ref 79–97)
MONOCYTES: 7 %
Monocytes Absolute: 0.7 10*3/uL (ref 0.1–0.9)
NEUTROS ABS: 5.7 10*3/uL (ref 1.4–7.0)
Neutrophils: 57 %
Platelets: 217 10*3/uL (ref 150–379)
RBC: 4.79 x10E6/uL (ref 3.77–5.28)
RDW: 13.6 % (ref 12.3–15.4)
WBC: 10.1 10*3/uL (ref 3.4–10.8)

## 2015-03-03 LAB — LIPID PANEL
CHOL/HDL RATIO: 3.1 ratio (ref 0.0–4.4)
Cholesterol, Total: 273 mg/dL — ABNORMAL HIGH (ref 100–199)
HDL: 89 mg/dL (ref >39)
LDL CALC: 160 mg/dL — AB (ref 0–99)
Triglycerides: 119 mg/dL (ref 0–149)
VLDL CHOLESTEROL CAL: 24 mg/dL (ref 5–40)

## 2015-03-03 LAB — TSH: TSH: 10.25 u[IU]/mL — AB (ref 0.450–4.500)

## 2015-03-03 NOTE — Telephone Encounter (Signed)
-----   Message from Glean Hess, MD sent at 03/03/2015 12:22 PM EST ----- Labs are normal except for thyroid which needs increasing. Take one whole tablet 150 mcg every day (charts says taking 1/2 tablet 2 days per week and 1 tablet other days). Will recheck TSH at next visit.

## 2015-03-03 NOTE — Telephone Encounter (Signed)
Left message for patient to call back  

## 2015-03-04 NOTE — Telephone Encounter (Signed)
Spoke with patient. Patient advised of all results and verbalized understanding. Will call back with any future questions or concerns. MAH  

## 2015-03-10 DIAGNOSIS — Z4789 Encounter for other orthopedic aftercare: Secondary | ICD-10-CM | POA: Diagnosis not present

## 2015-03-10 DIAGNOSIS — M25511 Pain in right shoulder: Secondary | ICD-10-CM | POA: Diagnosis not present

## 2015-03-10 DIAGNOSIS — Z4889 Encounter for other specified surgical aftercare: Secondary | ICD-10-CM | POA: Diagnosis not present

## 2015-03-10 DIAGNOSIS — G8929 Other chronic pain: Secondary | ICD-10-CM | POA: Diagnosis not present

## 2015-03-23 DIAGNOSIS — M8700 Idiopathic aseptic necrosis of unspecified bone: Secondary | ICD-10-CM | POA: Diagnosis not present

## 2015-03-23 DIAGNOSIS — I2510 Atherosclerotic heart disease of native coronary artery without angina pectoris: Secondary | ICD-10-CM | POA: Diagnosis not present

## 2015-03-23 DIAGNOSIS — R918 Other nonspecific abnormal finding of lung field: Secondary | ICD-10-CM | POA: Diagnosis not present

## 2015-04-24 ENCOUNTER — Encounter: Payer: Self-pay | Admitting: *Deleted

## 2015-04-29 ENCOUNTER — Other Ambulatory Visit: Payer: Self-pay

## 2015-04-29 MED ORDER — ZOLPIDEM TARTRATE 10 MG PO TABS: 10.0000 mg | ORAL_TABLET | Freq: Every day | ORAL | Status: DC

## 2015-04-29 NOTE — Telephone Encounter (Signed)
Received fax requesting from pharmacy.

## 2015-06-16 DIAGNOSIS — L821 Other seborrheic keratosis: Secondary | ICD-10-CM | POA: Diagnosis not present

## 2015-06-16 DIAGNOSIS — L578 Other skin changes due to chronic exposure to nonionizing radiation: Secondary | ICD-10-CM | POA: Diagnosis not present

## 2015-06-16 DIAGNOSIS — D2272 Melanocytic nevi of left lower limb, including hip: Secondary | ICD-10-CM | POA: Diagnosis not present

## 2015-06-16 DIAGNOSIS — D225 Melanocytic nevi of trunk: Secondary | ICD-10-CM | POA: Diagnosis not present

## 2015-06-29 DIAGNOSIS — Z1231 Encounter for screening mammogram for malignant neoplasm of breast: Secondary | ICD-10-CM | POA: Diagnosis not present

## 2015-06-29 DIAGNOSIS — Z853 Personal history of malignant neoplasm of breast: Secondary | ICD-10-CM | POA: Diagnosis not present

## 2015-08-08 DIAGNOSIS — M4696 Unspecified inflammatory spondylopathy, lumbar region: Secondary | ICD-10-CM | POA: Diagnosis not present

## 2015-08-08 DIAGNOSIS — M545 Low back pain: Secondary | ICD-10-CM | POA: Diagnosis not present

## 2015-08-08 DIAGNOSIS — M5136 Other intervertebral disc degeneration, lumbar region: Secondary | ICD-10-CM | POA: Diagnosis not present

## 2015-08-24 ENCOUNTER — Other Ambulatory Visit: Payer: Self-pay | Admitting: Internal Medicine

## 2015-08-24 DIAGNOSIS — M4696 Unspecified inflammatory spondylopathy, lumbar region: Secondary | ICD-10-CM | POA: Diagnosis not present

## 2015-08-24 MED ORDER — LOSARTAN POTASSIUM 50 MG PO TABS: 50.0000 mg | ORAL_TABLET | Freq: Every day | ORAL | Status: DC

## 2015-08-26 ENCOUNTER — Other Ambulatory Visit: Payer: Self-pay | Admitting: Internal Medicine

## 2015-08-26 MED ORDER — LOSARTAN POTASSIUM 50 MG PO TABS: 50.0000 mg | ORAL_TABLET | Freq: Every day | ORAL | Status: DC

## 2015-08-31 ENCOUNTER — Other Ambulatory Visit: Payer: Self-pay | Admitting: Internal Medicine

## 2015-08-31 ENCOUNTER — Ambulatory Visit (INDEPENDENT_AMBULATORY_CARE_PROVIDER_SITE_OTHER): Payer: Medicare Other | Admitting: Internal Medicine

## 2015-08-31 ENCOUNTER — Encounter: Payer: Self-pay | Admitting: Internal Medicine

## 2015-08-31 VITALS — BP 120/82 | HR 50 | Resp 16 | Ht 64.5 in | Wt 171.0 lb

## 2015-08-31 DIAGNOSIS — K5010 Crohn's disease of large intestine without complications: Secondary | ICD-10-CM | POA: Insufficient documentation

## 2015-08-31 DIAGNOSIS — M8700 Idiopathic aseptic necrosis of unspecified bone: Secondary | ICD-10-CM

## 2015-08-31 DIAGNOSIS — I10 Essential (primary) hypertension: Secondary | ICD-10-CM

## 2015-08-31 DIAGNOSIS — E559 Vitamin D deficiency, unspecified: Secondary | ICD-10-CM

## 2015-08-31 DIAGNOSIS — E034 Atrophy of thyroid (acquired): Secondary | ICD-10-CM

## 2015-08-31 DIAGNOSIS — E038 Other specified hypothyroidism: Secondary | ICD-10-CM

## 2015-08-31 MED ORDER — DILTIAZEM HCL ER 240 MG PO CP24: 240.0000 mg | ORAL_CAPSULE | Freq: Every day | ORAL | Status: DC

## 2015-08-31 MED ORDER — ZOLPIDEM TARTRATE 10 MG PO TABS: 10.0000 mg | ORAL_TABLET | Freq: Every day | ORAL | Status: DC

## 2015-08-31 NOTE — Progress Notes (Signed)
Date:  08/31/2015   Name:  Belinda Sanchez   DOB:  18-Jun-1940   MRN:  [id number]   Chief Complaint: Hypertension Hypertension This is a chronic problem. The current episode started more than 1 year ago. The problem is unchanged. The problem is controlled. Pertinent negatives include no chest pain, headaches, palpitations, peripheral edema or shortness of breath. Past treatments include beta blockers, angiotensin blockers and calcium channel blockers. The current treatment provides significant improvement. There are no compliance problems.  Hypertensive end-organ damage includes a thyroid problem.  Thyroid Problem Presents for follow-up (dose changed last visit) visit. Patient reports no constipation, fatigue, palpitations or tremors. The symptoms have been improving. Past treatments include levothyroxine.     Review of Systems  Constitutional: Negative for fever, appetite change, fatigue and unexpected weight change.  Eyes: Negative for visual disturbance.  Respiratory: Negative for cough, chest tightness and shortness of breath.   Cardiovascular: Negative for chest pain, palpitations and leg swelling.  Gastrointestinal: Negative for abdominal pain and constipation.  Endocrine: Negative for polydipsia and polyuria.  Genitourinary: Negative for dysuria and hematuria.  Musculoskeletal: Negative for arthralgias.  Skin: Negative for rash.  Neurological: Negative for tremors, numbness and headaches.  Psychiatric/Behavioral: Negative for dysphoric mood.    Patient Active Problem List   Diagnosis Date Noted  . Crohn's disease of colon ([place]) 08/31/2015  . 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 Herpes simplex infection 12/15/2014  . Acne erythematosa 12/03/2014  . Acquired hypothyroidism 12/03/2014  . CC (Crohn's colitis) ([place]) 12/03/2014  . Dyslipidemia 12/03/2014  . Essential (primary) hypertension 12/03/2014  . Idiopathic insomnia 12/03/2014  . Hx of breast  cancer 12/03/2014  . 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 Avitaminosis D 12/03/2014  . Avascular necrosis of bone ([place]) 10/21/2014  . Adult hypothyroidism 06/27/2014  . Malignant neoplasm of female breast ([place]) 06/27/2014    Prior to Admission medications   Medication Sig Start Date End Date Taking? Authorizing Provider  ammonium lactate (LAC-HYDRIN) 12 % lotion 1 GM APPLY ON THE SKIN DAILY 07/08/15  Yes Historical Provider, MD  BYSTOLIC 10 MG tablet TAKE ONE TABLET BY MOUTH EVERY DAY 11/02/14  Yes Glean Hess, MD  diltiazem (DILACOR XR) 240 MG 24 hr capsule TAKE ONE CAPSULE BY MOUTH DAILY 10/01/14  Yes Glean Hess, MD  Ergocalciferol (VITAMIN D2) 400 units TABS Take by mouth.   Yes Historical Provider, MD  Flaxseed, Linseed, (FLAXSEED OIL) 1000 MG CAPS Take by mouth.   Yes Historical Provider, MD  levothyroxine (SYNTHROID, LEVOTHROID) 150 MCG tablet TAKE 1 TABLET BY MOUTH DAILY *TAKE A HALF TABLET ON MON/FRI* 01/15/15  Yes Glean Hess, MD  losartan (COZAAR) 50 MG tablet Take 1 tablet (50 mg total) by mouth daily. 08/26/15  Yes Glean Hess, MD  meloxicam (MOBIC) 15 MG tablet Take 1 tablet by mouth daily. 08/08/15  Yes Historical Provider, MD  Multiple Vitamins tablet Take by mouth.   Yes Historical Provider, MD  Omega-3 Fatty Acids (FISH OIL) 1000 MG CPDR Take by mouth.   Yes Historical Provider, MD  zolpidem (AMBIEN) 10 MG tablet Take 1 tablet (10 mg total) by mouth at bedtime. 04/29/15  Yes Glean Hess, MD    Allergies  Allergen Reactions  . Lozol  [Indapamide]     elevated serum calcium  . Loratadine Palpitations    Past Surgical History  Procedure Laterality Date  . Mastectomy, radical Left 2009  . Vaginal hysterectomy  total  . Appendectomy    . Shoulder surgery  2016    AVN  . Breast excisional biopsy Right 04/2014    benign  . Colonoscopy  08/2010    one polyp - repeat 5 yrs    Social History  Substance Use Topics  . Smoking status: Never Smoker   .  Smokeless tobacco: None  . Alcohol Use: 1.2 oz/week    2 Standard drinks or equivalent per week     Comment: occasional     Medication list has been reviewed and updated.   Physical Exam  Constitutional: She is oriented to person, place, and time. She appears well-developed. No distress.  HENT:  Head: Normocephalic and atraumatic.  Neck: Normal range of motion. Neck supple.  Cardiovascular: Normal rate, regular rhythm and normal heart sounds.   Pulmonary/Chest: Effort normal and breath sounds normal. No respiratory distress.  Abdominal: Soft.  Musculoskeletal: Normal range of motion. She exhibits no edema or tenderness.  Neurological: She is alert and oriented to person, place, and time.  Skin: Skin is warm and dry. No rash noted.  Psychiatric: She has a normal mood and affect. Her behavior is normal. Thought content normal.  Nursing note and vitals reviewed.   BP 120/82 mmHg  Pulse 50  Resp 16  Ht 5' 4.5" (1.638 m)  Wt 171 lb (77.565 kg)  BMI 28.91 kg/m2  SpO2 95%  Assessment and Plan: 1. Essential (primary) hypertension controlled - diltiazem (DILACOR XR) 240 MG 24 hr capsule; Take 1 capsule (240 mg total) by mouth daily.  Dispense: 30 capsule; Refill: 11  2. Hypothyroidism due to acquired atrophy of thyroid Dose recently changed - recheck labs - TSH   Halina Maidens, MD [facility]  08/31/2015

## 2015-09-01 LAB — TSH: TSH: 2.46 u[IU]/mL (ref 0.450–4.500)

## 2015-09-01 NOTE — Progress Notes (Signed)
advised

## 2015-09-05 DIAGNOSIS — M25562 Pain in left knee: Secondary | ICD-10-CM | POA: Diagnosis not present

## 2015-09-05 DIAGNOSIS — M1712 Unilateral primary osteoarthritis, left knee: Secondary | ICD-10-CM | POA: Diagnosis not present

## 2015-10-09 DIAGNOSIS — K5792 Diverticulitis of intestine, part unspecified, without perforation or abscess without bleeding: Secondary | ICD-10-CM | POA: Diagnosis not present

## 2015-10-09 DIAGNOSIS — R1032 Left lower quadrant pain: Secondary | ICD-10-CM | POA: Diagnosis not present

## 2015-10-22 ENCOUNTER — Other Ambulatory Visit: Payer: Self-pay | Admitting: Internal Medicine

## 2015-10-22 ENCOUNTER — Encounter: Payer: Self-pay | Admitting: Internal Medicine

## 2015-10-26 ENCOUNTER — Ambulatory Visit (INDEPENDENT_AMBULATORY_CARE_PROVIDER_SITE_OTHER): Payer: Medicare Other | Admitting: Internal Medicine

## 2015-10-26 ENCOUNTER — Encounter: Payer: Self-pay | Admitting: Internal Medicine

## 2015-10-26 VITALS — BP 112/80 | HR 64 | Ht 64.0 in | Wt 172.0 lb

## 2015-10-26 DIAGNOSIS — K5792 Diverticulitis of intestine, part unspecified, without perforation or abscess without bleeding: Secondary | ICD-10-CM | POA: Diagnosis not present

## 2015-10-26 DIAGNOSIS — F5101 Primary insomnia: Secondary | ICD-10-CM | POA: Diagnosis not present

## 2015-10-26 DIAGNOSIS — Z23 Encounter for immunization: Secondary | ICD-10-CM

## 2015-10-26 DIAGNOSIS — I10 Essential (primary) hypertension: Secondary | ICD-10-CM | POA: Diagnosis not present

## 2015-10-26 MED ORDER — ZOLPIDEM TARTRATE 10 MG PO TABS: 10.0000 mg | ORAL_TABLET | Freq: Every day | ORAL | 1 refills | Status: DC

## 2015-10-26 MED ORDER — LOSARTAN POTASSIUM 50 MG PO TABS: 50.0000 mg | ORAL_TABLET | Freq: Every day | ORAL | 3 refills | Status: DC

## 2015-10-26 MED ORDER — NEBIVOLOL HCL 10 MG PO TABS: 10.0000 mg | ORAL_TABLET | Freq: Every day | ORAL | 3 refills | Status: DC

## 2015-10-26 MED ORDER — DILTIAZEM HCL ER 240 MG PO CP24: 240.0000 mg | ORAL_CAPSULE | Freq: Every day | ORAL | 3 refills | Status: DC

## 2015-10-26 NOTE — Progress Notes (Signed)
Date:  10/26/2015   Name:  Belinda Sanchez   DOB:  1940-08-28   MRN:  [id number]   Chief Complaint: Follow-up (from urgent care- started on Augmentin BID for 10 days for possibility of diverticulitis- no pain as of Sat) Patient was seen on 825 at [facility] urgent care for right lower quadrant abdominal pain. White count was slightly elevated. Urinalysis was negative. She was treated with Augmentin for 10 days for presumed diverticulitis. During this time the patient only had abdominal discomfort without diarrhea blood or mucus. She had a low-grade fever. She has not finished antibiotic and feels normal. She has a follow-up with GI next week and a colonoscopy scheduled in October.   Review of Systems  Constitutional: Negative for appetite change, fatigue, fever and unexpected weight change.  HENT: Negative for tinnitus and trouble swallowing.   Eyes: Negative for visual disturbance.  Respiratory: Negative for cough, chest tightness and shortness of breath.   Cardiovascular: Negative for chest pain, palpitations and leg swelling.  Gastrointestinal: Negative for abdominal pain, anal bleeding, blood in stool and diarrhea (but 4 stools per day).  Endocrine: Negative for polydipsia and polyuria.  Genitourinary: Negative for dysuria and hematuria.  Musculoskeletal: Positive for arthralgias. Negative for gait problem and joint swelling.  Skin: Negative for rash.  Neurological: Negative for tremors, numbness and headaches.  Psychiatric/Behavioral: Negative for dysphoric mood.    Patient Active Problem List   Diagnosis Date Noted  . Crohn's disease of colon ([place]) 08/31/2015  . 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 Herpes simplex infection 12/15/2014  . Acne erythematosa 12/03/2014  . Acquired hypothyroidism 12/03/2014  . CC (Crohn's colitis) ([place]) 12/03/2014  . Dyslipidemia 12/03/2014  . Essential (primary) hypertension 12/03/2014  . Idiopathic insomnia 12/03/2014  . Hx  of breast cancer 12/03/2014  . 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 Avitaminosis D 12/03/2014  . Avascular necrosis of bone ([place]) 10/21/2014  . Adult hypothyroidism 06/27/2014  . Malignant neoplasm of female breast ([place]) 06/27/2014    Prior to Admission medications   Medication Sig Start Date End Date Taking? Authorizing Provider  ammonium lactate (LAC-HYDRIN) 12 % lotion 1 GM APPLY ON THE SKIN DAILY 07/08/15  Yes Historical Provider, MD  BYSTOLIC 10 MG tablet TAKE ONE TABLET BY MOUTH EVERY DAY 11/02/14  Yes Glean Hess, MD  diltiazem (DILACOR XR) 240 MG 24 hr capsule Take 1 capsule (240 mg total) by mouth daily. 08/31/15  Yes Glean Hess, MD  Ergocalciferol (VITAMIN D2) 400 units TABS Take by mouth. Pt taking 5,000 units daily   Yes Historical Provider, MD  Flaxseed, Linseed, (FLAXSEED OIL) 1000 MG CAPS Take by mouth.   Yes Historical Provider, MD  levothyroxine (SYNTHROID, LEVOTHROID) 150 MCG tablet TAKE 1 TABLET BY MOUTH DAILY *TAKE A HALF TABLET ON MON/FRI* Patient taking differently: TAKE 1 TABLET BY MOUTH DAILY 10/22/15  Yes Glean Hess, MD  losartan (COZAAR) 50 MG tablet Take 1 tablet (50 mg total) by mouth daily. 08/26/15  Yes Glean Hess, MD  Multiple Vitamins tablet Take by mouth.   Yes Historical Provider, MD  Omega-3 Fatty Acids (FISH OIL) 1000 MG CPDR Take by mouth.   Yes Historical Provider, MD  zolpidem (AMBIEN) 10 MG tablet Take 1 tablet (10 mg total) by mouth at bedtime. 08/31/15  Yes Glean Hess, MD  meloxicam (MOBIC) 15 MG tablet Take 1 tablet by mouth daily. 08/08/15   Historical Provider, MD    Allergies  Allergen Reactions  .  Lozol  [Indapamide]     elevated serum calcium  . Loratadine Palpitations    Past Surgical History:  Procedure Laterality Date  . APPENDECTOMY    . BREAST EXCISIONAL BIOPSY Right 04/2014   benign  . COLONOSCOPY  08/2010   one polyp - repeat 5 yrs  . MASTECTOMY, RADICAL Left 2009  . SHOULDER SURGERY  2016   AVN  . VAGINAL  HYSTERECTOMY     total    Social History  Substance Use Topics  . Smoking status: Never Smoker  . Smokeless tobacco: Not on file  . Alcohol use 1.2 oz/week    2 Standard drinks or equivalent per week     Comment: occasional     Medication list has been reviewed and updated.   Physical Exam  Constitutional: She is oriented to person, place, and time. She appears well-developed. No distress.  HENT:  Head: Normocephalic and atraumatic.  Cardiovascular: Normal rate, regular rhythm and normal heart sounds.   Pulmonary/Chest: Effort normal. No respiratory distress. She has no wheezes.  Abdominal: Soft. Bowel sounds are normal. She exhibits no distension and no mass. There is no tenderness. There is no rebound and no guarding.  Musculoskeletal: Normal range of motion. She exhibits no edema.  Neurological: She is alert and oriented to person, place, and time.  Skin: Skin is warm and dry. No rash noted.  Psychiatric: She has a normal mood and affect. Her behavior is normal. Thought content normal.  Nursing note and vitals reviewed.   BP 112/80   Pulse 64   Ht 5' 4"  (1.626 m)   Wt 172 lb (78 kg)   BMI 29.52 kg/m   Assessment and Plan: 1. Essential (primary) hypertension controlled - diltiazem (DILACOR XR) 240 MG 24 hr capsule; Take 1 capsule (240 mg total) by mouth daily.  Dispense: 90 capsule; Refill: 3 - losartan (COZAAR) 50 MG tablet; Take 1 tablet (50 mg total) by mouth daily.  Dispense: 90 tablet; Refill: 3 - nebivolol (BYSTOLIC) 10 MG tablet; Take 1 tablet (10 mg total) by mouth daily.  Dispense: 90 tablet; Refill: 3  2. Idiopathic insomnia - zolpidem (AMBIEN) 10 MG tablet; Take 1 tablet (10 mg total) by mouth at bedtime.  Dispense: 90 tablet; Refill: 1  3. Diverticulitis of intestine w/o perforation or abscess w/o bleeding Appears resolved - recheck CBC Follow up with GI as planned - CBC with Differential/Platelet  4. Need for influenza vaccination - Flu Vaccine  QUAD 36+ mos IM   Halina Maidens, MD [facility]  10/26/2015

## 2015-10-27 LAB — CBC WITH DIFFERENTIAL/PLATELET
BASOS: 2 %
Basophils Absolute: 0.1 10*3/uL (ref 0.0–0.2)
EOS (ABSOLUTE): 0.2 10*3/uL (ref 0.0–0.4)
EOS: 3 %
HEMATOCRIT: 42.6 % (ref 34.0–46.6)
Hemoglobin: 14.5 g/dL (ref 11.1–15.9)
IMMATURE GRANS (ABS): 0 10*3/uL (ref 0.0–0.1)
IMMATURE GRANULOCYTES: 0 %
LYMPHS: 34 %
Lymphocytes Absolute: 2.7 10*3/uL (ref 0.7–3.1)
MCH: 31.7 pg (ref 26.6–33.0)
MCHC: 34 g/dL (ref 31.5–35.7)
MCV: 93 fL (ref 79–97)
MONOCYTES: 10 %
Monocytes Absolute: 0.8 10*3/uL (ref 0.1–0.9)
NEUTROS PCT: 51 %
Neutrophils Absolute: 4.2 10*3/uL (ref 1.4–7.0)
Platelets: 202 10*3/uL (ref 150–379)
RBC: 4.58 x10E6/uL (ref 3.77–5.28)
RDW: 13.3 % (ref 12.3–15.4)
WBC: 8 10*3/uL (ref 3.4–10.8)

## 2015-10-30 ENCOUNTER — Other Ambulatory Visit: Payer: Self-pay | Admitting: Internal Medicine

## 2015-10-30 DIAGNOSIS — I10 Essential (primary) hypertension: Secondary | ICD-10-CM

## 2015-10-30 MED ORDER — DILTIAZEM HCL ER 240 MG PO CP24: 240.0000 mg | ORAL_CAPSULE | Freq: Every day | ORAL | 3 refills | Status: DC

## 2015-11-03 DIAGNOSIS — K5732 Diverticulitis of large intestine without perforation or abscess without bleeding: Secondary | ICD-10-CM | POA: Diagnosis not present

## 2015-12-14 DIAGNOSIS — Z800 Family history of malignant neoplasm of digestive organs: Secondary | ICD-10-CM | POA: Diagnosis not present

## 2015-12-14 DIAGNOSIS — D124 Benign neoplasm of descending colon: Secondary | ICD-10-CM | POA: Diagnosis not present

## 2015-12-14 DIAGNOSIS — K5732 Diverticulitis of large intestine without perforation or abscess without bleeding: Secondary | ICD-10-CM | POA: Diagnosis not present

## 2015-12-14 DIAGNOSIS — Z86010 Personal history of colonic polyps: Secondary | ICD-10-CM | POA: Diagnosis not present

## 2015-12-14 DIAGNOSIS — D123 Benign neoplasm of transverse colon: Secondary | ICD-10-CM | POA: Diagnosis not present

## 2015-12-14 DIAGNOSIS — K635 Polyp of colon: Secondary | ICD-10-CM | POA: Diagnosis not present

## 2015-12-14 DIAGNOSIS — D126 Benign neoplasm of colon, unspecified: Secondary | ICD-10-CM | POA: Diagnosis not present

## 2015-12-31 DIAGNOSIS — R1084 Generalized abdominal pain: Secondary | ICD-10-CM | POA: Diagnosis not present

## 2016-02-10 ENCOUNTER — Telehealth: Payer: Self-pay | Admitting: Internal Medicine

## 2016-02-10 NOTE — Telephone Encounter (Signed)
Pt need prescription for Bra.Marland KitchenMarland Kitchen

## 2016-03-07 ENCOUNTER — Ambulatory Visit (INDEPENDENT_AMBULATORY_CARE_PROVIDER_SITE_OTHER): Payer: Medicare Other | Admitting: Internal Medicine

## 2016-03-07 ENCOUNTER — Encounter: Payer: Self-pay | Admitting: Internal Medicine

## 2016-03-07 VITALS — BP 128/78 | HR 86 | Temp 97.6°F | Ht 64.0 in | Wt 175.0 lb

## 2016-03-07 DIAGNOSIS — C50912 Malignant neoplasm of unspecified site of left female breast: Secondary | ICD-10-CM

## 2016-03-07 DIAGNOSIS — I10 Essential (primary) hypertension: Secondary | ICD-10-CM | POA: Diagnosis not present

## 2016-03-07 DIAGNOSIS — M1711 Unilateral primary osteoarthritis, right knee: Secondary | ICD-10-CM

## 2016-03-07 DIAGNOSIS — E039 Hypothyroidism, unspecified: Secondary | ICD-10-CM | POA: Diagnosis not present

## 2016-03-07 DIAGNOSIS — K5010 Crohn's disease of large intestine without complications: Secondary | ICD-10-CM

## 2016-03-07 DIAGNOSIS — Z0000 Encounter for general adult medical examination without abnormal findings: Secondary | ICD-10-CM | POA: Diagnosis not present

## 2016-03-07 DIAGNOSIS — E785 Hyperlipidemia, unspecified: Secondary | ICD-10-CM

## 2016-03-07 LAB — POCT URINALYSIS DIPSTICK
BILIRUBIN UA: NEGATIVE
GLUCOSE UA: NEGATIVE
Ketones, UA: NEGATIVE
Leukocytes, UA: NEGATIVE
Nitrite, UA: NEGATIVE
Protein, UA: NEGATIVE
SPEC GRAV UA: 1.01
UROBILINOGEN UA: 0.2
pH, UA: 5

## 2016-03-07 NOTE — Patient Instructions (Signed)
Health Maintenance  Topic Date Due  . MAMMOGRAM  06/28/2017  . TETANUS/TDAP  05/28/2023  . COLONOSCOPY  12/13/2025  . INFLUENZA VACCINE  Completed  . DEXA SCAN  Addressed  . ZOSTAVAX  Completed  . PNA vac Low Risk Adult  Completed

## 2016-03-07 NOTE — Progress Notes (Signed)
Patient: Belinda Sanchez, Female    DOB: March 19, 1940, 76 y.o.   MRN: [id number] Visit Date: 03/07/2016  Today's Provider: Halina Maidens, MD   Chief Complaint  Patient presents with  . Annual Exam   Subjective:    Annual wellness visit Riane Rung is a 76 y.o. female who presents today for her Subsequent Annual Wellness Visit. She feels well. She reports exercising regularly. She reports she is sleeping fairly well.  For breast follow up she is just on routine mammograms. ----------------------------------------------------------- Hypertension  This is a chronic problem. The problem is controlled. Pertinent negatives include no chest pain, headaches, palpitations or shortness of breath. Past treatments include beta blockers, calcium channel blockers and angiotensin blockers. The current treatment provides moderate improvement. There are no compliance problems (but bystolic price has increased).  Identifiable causes of hypertension include a thyroid problem.  Thyroid Problem  Presents for follow-up visit. Patient reports no anxiety, cold intolerance, constipation, depressed mood, diarrhea, fatigue, hoarse voice, palpitations, tremors or visual change. The symptoms have been stable.  Crohn's disease - had colonoscopy in October 2017 and was normal.  She has no sx and feels well.  Review of Systems  Constitutional: Negative for chills, fatigue and fever.  HENT: Negative for congestion, hearing loss, hoarse voice, tinnitus, trouble swallowing and voice change.   Eyes: Negative for visual disturbance.  Respiratory: Negative for cough, chest tightness, shortness of breath and wheezing.   Cardiovascular: Negative for chest pain, palpitations and leg swelling.  Gastrointestinal: Negative for abdominal pain, constipation, diarrhea and vomiting.  Endocrine: Negative for cold intolerance, polydipsia and polyuria.  Genitourinary: Negative for dysuria, frequency, genital sores, vaginal bleeding and  vaginal discharge.  Musculoskeletal: Positive for arthralgias (right knee). Negative for gait problem and joint swelling.  Skin: Negative for color change and rash.  Neurological: Negative for dizziness, tremors, light-headedness and headaches.  Hematological: Negative for adenopathy. Does not bruise/bleed easily.  Psychiatric/Behavioral: Positive for sleep disturbance. Negative for dysphoric mood. The patient is not nervous/anxious.     Social History   Social History  . Marital status: Unknown    Spouse name: N/A  . Number of children: N/A  . Years of education: N/A   Occupational History  . receptionist    Social History Main Topics  . Smoking status: Never Smoker  . Smokeless tobacco: Never Used  . Alcohol use 1.2 oz/week    2 Standard drinks or equivalent per week     Comment: occasional  . Drug use: No  . Sexual activity: Not on file   Other Topics Concern  . Not on file   Social History Narrative  . No narrative on file    Patient Active Problem List   Diagnosis Date Noted  . 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 Herpes simplex infection 12/15/2014  . Acne erythematosa 12/03/2014  . Acquired hypothyroidism 12/03/2014  . CC (Crohn's colitis) ([place]) 12/03/2014  . Dyslipidemia 12/03/2014  . Essential (primary) hypertension 12/03/2014  . Idiopathic insomnia 12/03/2014  . Osteoarthritis of right knee 12/03/2014  . Avitaminosis D 12/03/2014  . Avascular necrosis of bone ([place]) 10/21/2014  . Malignant neoplasm of left female breast ([place]) 06/27/2014    Past Surgical History:  Procedure Laterality Date  . APPENDECTOMY    . BREAST EXCISIONAL BIOPSY Right 04/2014   benign  . COLONOSCOPY  08/2010   one polyp - repeat 5 yrs  . MASTECTOMY, RADICAL Left 2009  . SHOULDER SURGERY  2016   AVN  .  VAGINAL HYSTERECTOMY     total    Her family history includes Diabetes in her mother; Hypertension in her mother.     Previous Medications    AMMONIUM LACTATE (LAC-HYDRIN) 12 % LOTION    1 GM APPLY ON THE SKIN DAILY   DILTIAZEM (DILACOR XR) 240 MG 24 HR CAPSULE    Take 1 capsule (240 mg total) by mouth daily.   ERGOCALCIFEROL (VITAMIN D2) 400 UNITS TABS    Take by mouth. Pt taking 5,000 units daily   FLAXSEED, LINSEED, (FLAXSEED OIL) 1000 MG CAPS    Take by mouth.   LEVOTHYROXINE (SYNTHROID, LEVOTHROID) 150 MCG TABLET    TAKE 1 TABLET BY MOUTH DAILY *TAKE A HALF TABLET ON MON/FRI*   LOSARTAN (COZAAR) 50 MG TABLET    Take 1 tablet (50 mg total) by mouth daily.   MULTIPLE VITAMINS TABLET    Take by mouth.   NEBIVOLOL (BYSTOLIC) 10 MG TABLET    Take 1 tablet (10 mg total) by mouth daily.   OMEGA-3 FATTY ACIDS (FISH OIL) 1000 MG CPDR    Take by mouth.   ZOLPIDEM (AMBIEN) 10 MG TABLET    Take 1 tablet (10 mg total) by mouth at bedtime.    Patient Care Team: Glean Hess, MD as PCP - General (Family Medicine) Hershal Coria (Inactive) as Referring Physician (Gastroenterology)      Objective:   Vitals: BP 128/78   Pulse 86   Temp 97.6 F (36.4 C)   Ht 5' 4"  (1.626 m)   Wt 175 lb (79.4 kg)   SpO2 98%   BMI 30.04 kg/m   Physical Exam  Constitutional: She is oriented to person, place, and time. She appears well-developed and well-nourished. No distress.  HENT:  Head: Normocephalic and atraumatic.  Right Ear: Tympanic membrane and ear canal normal.  Left Ear: Tympanic membrane and ear canal normal.  Nose: Right sinus exhibits no maxillary sinus tenderness. Left sinus exhibits no maxillary sinus tenderness.  Mouth/Throat: Uvula is midline and oropharynx is clear and moist.  Eyes: Conjunctivae and EOM are normal. Right eye exhibits no discharge. Left eye exhibits no discharge. No scleral icterus.  Neck: Normal range of motion. Carotid bruit is not present. No erythema present. No thyromegaly present.  Cardiovascular: Normal rate, regular rhythm, normal heart sounds and normal pulses.   Pulmonary/Chest: Effort normal. No  respiratory distress. She has no wheezes. Right breast exhibits no mass, no nipple discharge, no skin change and no tenderness.  Left mastectomy site clear  Abdominal: Soft. Bowel sounds are normal. There is no hepatosplenomegaly. There is no tenderness. There is no CVA tenderness.  Musculoskeletal: Normal range of motion.       Right knee: She exhibits effusion. She exhibits normal range of motion, no ecchymosis and no erythema.  Lymphadenopathy:    She has no cervical adenopathy.    She has no axillary adenopathy.  Neurological: She is alert and oriented to person, place, and time. She has normal reflexes. No cranial nerve deficit or sensory deficit.  Skin: Skin is warm, dry and intact. No rash noted.  Psychiatric: She has a normal mood and affect. Her speech is normal and behavior is normal. Thought content normal.  Nursing note and vitals reviewed.   Activities of Daily Living In your present state of health, do you have any difficulty performing the following activities: 03/07/2016 08/31/2015  Hearing? N N  Vision? N N  Difficulty concentrating or making decisions? N N  Walking or climbing stairs? N N  Dressing or bathing? N N  Doing errands, shopping? N N  Preparing Food and eating ? N -  Using the Toilet? N -  In the past six months, have you accidently leaked urine? N -  Do you have problems with loss of bowel control? N -  Managing your Medications? N -  Managing your Finances? N -  Housekeeping or managing your Housekeeping? N -  Some recent data might be hidden    Fall Risk Assessment Fall Risk  03/07/2016 03/07/2016 08/31/2015 12/15/2014  Falls in the past year? No No No No      Depression Screen PHQ 2/9 Scores 03/07/2016 03/07/2016 08/31/2015 12/15/2014  PHQ - 2 Score 0 0 0 0    6CIT Screen 03/07/2016  What Year? 0 points  What month? 0 points  What time? 0 points  Count back from 20 0 points  Months in reverse 0 points  Repeat phrase 0 points  Total Score 0      Medicare Annual Wellness Visit Summary:  Reviewed patient's Family Medical History Reviewed and updated list of patient's medical providers Assessment of cognitive impairment was done Assessed patient's functional ability Established a written schedule for health screening [place] Completed and Reviewed  Exercise Activities and Dietary recommendations Goals    . Exercise 150 minutes per week (moderate activity)          Get back to exercise that has been on hold since shoulder surgery    . Weight (lb) < 200 lb (90.7 kg)       Immunization History  Administered Date(s) Administered  . Influenza,inj,Quad PF,36+ Mos 12/15/2014, 10/26/2015  . Pneumococcal Conjugate-13 12/23/2013  . Pneumococcal Polysaccharide-23 02/16/2007, 12/10/2012  . Tdap 05/27/2013  . Zoster 02/15/2010    Health Maintenance  Topic Date Due  . MAMMOGRAM  06/28/2017  . TETANUS/TDAP  05/28/2023  . COLONOSCOPY  12/13/2025  . INFLUENZA VACCINE  Completed  . DEXA SCAN  Addressed  . ZOSTAVAX  Completed  . PNA vac Low Risk Adult  Completed    Discussed health benefits of physical activity, and encouraged her to engage in regular exercise appropriate for her age and condition.    ------------------------------------------------------------------------------------------------------------  Assessment & Plan:  1. Medicare annual wellness visit, subsequent Measures satisfied - POCT urinalysis dipstick  2. Essential (primary) hypertension Controlled Could change bystolic to metoprolol - CBC with Differential/Platelet - Comprehensive metabolic panel  3. Crohn's disease of colon without complication ([place]) Stable - recent colonoscopy normal  4. Acquired hypothyroidism supplemented - TSH  5. Dyslipidemia On statin therapy - Lipid panel  6. Malignant neoplasm of left female breast, unspecified estrogen receptor status, unspecified site of breast ([place]) - MM Digital  Diagnostic Unilat R  7. Primary osteoarthritis of right knee Continue modified activities Consider Ortho eval/steroid injection   Halina Maidens, MD [facility]  03/07/2016

## 2016-03-08 LAB — LIPID PANEL
Chol/HDL Ratio: 3.3 ratio units (ref 0.0–4.4)
Cholesterol, Total: 279 mg/dL — ABNORMAL HIGH (ref 100–199)
HDL: 85 mg/dL (ref >39)
LDL Calculated: 171 mg/dL — ABNORMAL HIGH (ref 0–99)
Triglycerides: 116 mg/dL (ref 0–149)
VLDL Cholesterol Cal: 23 mg/dL (ref 5–40)

## 2016-03-08 LAB — CBC WITH DIFFERENTIAL/PLATELET
BASOS: 1 %
Basophils Absolute: 0.1 10*3/uL (ref 0.0–0.2)
EOS (ABSOLUTE): 0.2 10*3/uL (ref 0.0–0.4)
Eos: 2 %
Hematocrit: 45.7 % (ref 34.0–46.6)
Hemoglobin: 15.1 g/dL (ref 11.1–15.9)
IMMATURE GRANS (ABS): 0 10*3/uL (ref 0.0–0.1)
Immature Granulocytes: 0 %
LYMPHS: 38 %
Lymphocytes Absolute: 3 10*3/uL (ref 0.7–3.1)
MCH: 31.9 pg (ref 26.6–33.0)
MCHC: 33 g/dL (ref 31.5–35.7)
MCV: 97 fL (ref 79–97)
MONOS ABS: 0.6 10*3/uL (ref 0.1–0.9)
Monocytes: 8 %
NEUTROS ABS: 3.9 10*3/uL (ref 1.4–7.0)
Neutrophils: 51 %
PLATELETS: 184 10*3/uL (ref 150–379)
RBC: 4.73 x10E6/uL (ref 3.77–5.28)
RDW: 13.2 % (ref 12.3–15.4)
WBC: 7.8 10*3/uL (ref 3.4–10.8)

## 2016-03-08 LAB — COMPREHENSIVE METABOLIC PANEL
A/G RATIO: 1.8 (ref 1.2–2.2)
ALT: 35 IU/L — AB (ref 0–32)
AST: 34 IU/L (ref 0–40)
Albumin: 4.6 g/dL (ref 3.5–4.8)
Alkaline Phosphatase: 103 IU/L (ref 39–117)
BILIRUBIN TOTAL: 0.6 mg/dL (ref 0.0–1.2)
BUN / CREAT RATIO: 24 (ref 12–28)
BUN: 21 mg/dL (ref 8–27)
CHLORIDE: 99 mmol/L (ref 96–106)
CO2: 28 mmol/L (ref 18–29)
Calcium: 10.1 mg/dL (ref 8.7–10.3)
Creatinine, Ser: 0.88 mg/dL (ref 0.57–1.00)
GFR calc non Af Amer: 65 mL/min/{1.73_m2} (ref >59)
GFR, EST AFRICAN AMERICAN: 75 mL/min/{1.73_m2} (ref >59)
Globulin, Total: 2.6 g/dL (ref 1.5–4.5)
Glucose: 99 mg/dL (ref 65–99)
POTASSIUM: 4.6 mmol/L (ref 3.5–5.2)
Sodium: 144 mmol/L (ref 134–144)
TOTAL PROTEIN: 7.2 g/dL (ref 6.0–8.5)

## 2016-03-08 LAB — TSH: TSH: 3.49 u[IU]/mL (ref 0.450–4.500)

## 2016-03-27 DIAGNOSIS — M170 Bilateral primary osteoarthritis of knee: Secondary | ICD-10-CM | POA: Diagnosis not present

## 2016-03-27 DIAGNOSIS — M25561 Pain in right knee: Secondary | ICD-10-CM | POA: Diagnosis not present

## 2016-03-27 DIAGNOSIS — G8929 Other chronic pain: Secondary | ICD-10-CM | POA: Diagnosis not present

## 2016-06-10 ENCOUNTER — Other Ambulatory Visit: Payer: Self-pay | Admitting: Internal Medicine

## 2016-06-10 DIAGNOSIS — F5101 Primary insomnia: Secondary | ICD-10-CM

## 2016-06-13 ENCOUNTER — Telehealth: Payer: Self-pay

## 2016-06-13 NOTE — Telephone Encounter (Signed)
Pt calling requesting refill on Ambien. Only has 1 pill left. Wants sent to [place].

## 2016-06-14 DIAGNOSIS — L821 Other seborrheic keratosis: Secondary | ICD-10-CM | POA: Diagnosis not present

## 2016-06-14 DIAGNOSIS — D2262 Melanocytic nevi of left upper limb, including shoulder: Secondary | ICD-10-CM | POA: Diagnosis not present

## 2016-06-14 DIAGNOSIS — D2272 Melanocytic nevi of left lower limb, including hip: Secondary | ICD-10-CM | POA: Diagnosis not present

## 2016-06-14 DIAGNOSIS — L578 Other skin changes due to chronic exposure to nonionizing radiation: Secondary | ICD-10-CM | POA: Diagnosis not present

## 2016-06-14 DIAGNOSIS — D225 Melanocytic nevi of trunk: Secondary | ICD-10-CM | POA: Diagnosis not present

## 2016-07-04 DIAGNOSIS — M79672 Pain in left foot: Secondary | ICD-10-CM | POA: Diagnosis not present

## 2016-07-04 DIAGNOSIS — Q828 Other specified congenital malformations of skin: Secondary | ICD-10-CM | POA: Diagnosis not present

## 2016-07-04 DIAGNOSIS — L859 Epidermal thickening, unspecified: Secondary | ICD-10-CM | POA: Diagnosis not present

## 2016-07-04 DIAGNOSIS — M21969 Unspecified acquired deformity of unspecified lower leg: Secondary | ICD-10-CM | POA: Diagnosis not present

## 2016-07-18 DIAGNOSIS — Z9012 Acquired absence of left breast and nipple: Secondary | ICD-10-CM | POA: Diagnosis not present

## 2016-07-18 DIAGNOSIS — Z853 Personal history of malignant neoplasm of breast: Secondary | ICD-10-CM | POA: Diagnosis not present

## 2016-07-18 DIAGNOSIS — Z1231 Encounter for screening mammogram for malignant neoplasm of breast: Secondary | ICD-10-CM | POA: Diagnosis not present

## 2016-07-18 LAB — HM MAMMOGRAPHY

## 2016-07-19 ENCOUNTER — Encounter: Payer: Self-pay | Admitting: Internal Medicine

## 2016-08-10 DIAGNOSIS — Z87891 Personal history of nicotine dependence: Secondary | ICD-10-CM | POA: Diagnosis not present

## 2016-08-10 DIAGNOSIS — Z209 Contact with and (suspected) exposure to unspecified communicable disease: Secondary | ICD-10-CM | POA: Diagnosis not present

## 2016-08-10 DIAGNOSIS — Z203 Contact with and (suspected) exposure to rabies: Secondary | ICD-10-CM | POA: Diagnosis not present

## 2016-08-13 DIAGNOSIS — Z23 Encounter for immunization: Secondary | ICD-10-CM | POA: Diagnosis not present

## 2016-08-13 DIAGNOSIS — Z203 Contact with and (suspected) exposure to rabies: Secondary | ICD-10-CM | POA: Diagnosis not present

## 2016-08-17 DIAGNOSIS — Z2914 Encounter for prophylactic rabies immune globin: Secondary | ICD-10-CM | POA: Diagnosis not present

## 2016-08-17 DIAGNOSIS — Z203 Contact with and (suspected) exposure to rabies: Secondary | ICD-10-CM | POA: Diagnosis not present

## 2016-08-17 DIAGNOSIS — Z23 Encounter for immunization: Secondary | ICD-10-CM | POA: Diagnosis not present

## 2016-08-24 DIAGNOSIS — Z23 Encounter for immunization: Secondary | ICD-10-CM | POA: Diagnosis not present

## 2016-08-24 DIAGNOSIS — Z2914 Encounter for prophylactic rabies immune globin: Secondary | ICD-10-CM | POA: Diagnosis not present

## 2016-08-24 DIAGNOSIS — Z203 Contact with and (suspected) exposure to rabies: Secondary | ICD-10-CM | POA: Diagnosis not present

## 2016-09-21 DIAGNOSIS — L851 Acquired keratosis [keratoderma] palmaris et plantaris: Secondary | ICD-10-CM | POA: Diagnosis not present

## 2016-09-21 DIAGNOSIS — M79672 Pain in left foot: Secondary | ICD-10-CM | POA: Diagnosis not present

## 2016-09-21 DIAGNOSIS — Q828 Other specified congenital malformations of skin: Secondary | ICD-10-CM | POA: Diagnosis not present

## 2016-10-02 DIAGNOSIS — M1712 Unilateral primary osteoarthritis, left knee: Secondary | ICD-10-CM | POA: Diagnosis not present

## 2016-11-15 ENCOUNTER — Other Ambulatory Visit: Payer: Self-pay | Admitting: Internal Medicine

## 2016-11-15 DIAGNOSIS — I10 Essential (primary) hypertension: Secondary | ICD-10-CM

## 2016-11-18 ENCOUNTER — Other Ambulatory Visit: Payer: Self-pay | Admitting: Internal Medicine

## 2016-11-18 DIAGNOSIS — I10 Essential (primary) hypertension: Secondary | ICD-10-CM

## 2016-11-18 DIAGNOSIS — F5101 Primary insomnia: Secondary | ICD-10-CM

## 2016-11-20 ENCOUNTER — Other Ambulatory Visit: Payer: Self-pay | Admitting: Internal Medicine

## 2016-11-22 DIAGNOSIS — Z23 Encounter for immunization: Secondary | ICD-10-CM | POA: Diagnosis not present

## 2016-11-24 ENCOUNTER — Other Ambulatory Visit: Payer: Self-pay | Admitting: Internal Medicine

## 2016-11-24 DIAGNOSIS — F5101 Primary insomnia: Secondary | ICD-10-CM

## 2016-11-26 ENCOUNTER — Other Ambulatory Visit: Payer: Self-pay | Admitting: Internal Medicine

## 2016-11-26 DIAGNOSIS — F5101 Primary insomnia: Secondary | ICD-10-CM

## 2016-12-01 ENCOUNTER — Telehealth: Payer: Self-pay

## 2016-12-01 NOTE — Telephone Encounter (Signed)
PA done for patients Ambien medication for Idiopathic Insomnia. Patient informed. Patient stated that if we don't get response by Monday patient will just pay out of pocket for medication.

## 2016-12-08 ENCOUNTER — Ambulatory Visit (INDEPENDENT_AMBULATORY_CARE_PROVIDER_SITE_OTHER): Payer: Medicare Other

## 2016-12-08 VITALS — BP 124/82 | HR 60 | Temp 97.5°F | Resp 16 | Ht 64.0 in | Wt 180.4 lb

## 2016-12-08 DIAGNOSIS — Z0000 Encounter for general adult medical examination without abnormal findings: Secondary | ICD-10-CM

## 2016-12-08 NOTE — Patient Instructions (Signed)
Ms. Belinda Sanchez , Thank you for taking time to come for your Medicare Wellness Visit. I appreciate your ongoing commitment to your health goals. Please review the following plan we discussed and let me know if I can assist you in the future.   Screening recommendations/referrals: Colonoscopy: Completed 12/14/15 Mammogram: Completed 07/18/16 Bone Density: Completed 07/21/12 Recommended yearly ophthalmology/optometry visit for glaucoma screening and checkup Recommended yearly dental visit for hygiene and checkup  Vaccinations: Influenza vaccine: Completed. Please provide a copy of your immunization record Pneumococcal vaccine: Completed series Tdap vaccine: Up to date Shingles vaccine: Completed 1 dose of Shingrix. States will complete series later this week. Please provide a copy of your immunization record    Advanced directives: Please bring a copy of your POA (Power of [place]) and/or Living Will to your next appointment.   Conditions/risks identified: Fall risk prevention discussed  Next appointment: Scheduled to see Dr. [name] on 03/08/17 @ 10:30am. Follow up in one year for your annual wellness exam.   Preventive Care 65 Years and Older, Female Preventive care refers to lifestyle choices and visits with your health care provider that can promote health and wellness. What does preventive care include?  A yearly physical exam. This is also called an annual well check.  Dental exams once or twice a year.  Routine eye exams. Ask your health care provider how often you should have your eyes checked.  Personal lifestyle choices, including:  Daily care of your teeth and gums.  Regular physical activity.  Eating a healthy diet.  Avoiding tobacco and drug use.  Limiting alcohol use.  Practicing safe sex.  Taking low-dose aspirin every day.  Taking vitamin and mineral supplements as recommended by your health care provider. What happens during an annual well check? The  services and screenings done by your health care provider during your annual well check will depend on your age, overall health, lifestyle risk factors, and family history of disease. Counseling  Your health care provider may ask you questions about your:  Alcohol use.  Tobacco use.  Drug use.  Emotional well-being.  Home and relationship well-being.  Sexual activity.  Eating habits.  History of falls.  Memory and ability to understand (cognition).  Work and work Statistician.  Reproductive health. Screening  You may have the following tests or measurements:  Height, weight, and BMI.  Blood pressure.  Lipid and cholesterol levels. These may be checked every 5 years, or more frequently if you are over 90 years old.  Skin check.  Lung cancer screening. You may have this screening every year starting at age 21 if you have a 30-pack-year history of smoking and currently smoke or have quit within the past 15 years.  Fecal occult blood test (FOBT) of the stool. You may have this test every year starting at age 31.  Flexible sigmoidoscopy or colonoscopy. You may have a sigmoidoscopy every 5 years or a colonoscopy every 10 years starting at age 26.  Hepatitis C blood test.  Hepatitis B blood test.  Sexually transmitted disease (STD) testing.  Diabetes screening. This is done by checking your blood sugar (glucose) after you have not eaten for a while (fasting). You may have this done every 1-3 years.  Bone density scan. This is done to screen for osteoporosis. You may have this done starting at age 2.  Mammogram. This may be done every 1-2 years. Talk to your health care provider about how often you should have regular mammograms. Talk with your health  care provider about your test results, treatment options, and if necessary, the need for more tests. Vaccines  Your health care provider may recommend certain vaccines, such as:  Influenza vaccine. This is recommended  every year.  Tetanus, diphtheria, and acellular pertussis (Tdap, Td) vaccine. You may need a Td booster every 10 years.  Zoster vaccine. You may need this after age 1.  Pneumococcal 13-valent conjugate (PCV13) vaccine. One dose is recommended after age 48.  Pneumococcal polysaccharide (PPSV23) vaccine. One dose is recommended after age 21. Talk to your health care provider about which screenings and vaccines you need and how often you need them. This information is not intended to replace advice given to you by your health care provider. Make sure you discuss any questions you have with your health care provider. Document Released: 02/27/2015 Document Revised: 10/21/2015 Document Reviewed: 12/02/2014 Elsevier Interactive Patient Education  2017 [place] Prevention in the Home Falls can cause injuries. They can happen to people of all ages. There are many things you can do to make your home safe and to help prevent falls. What can I do on the outside of my home?  Regularly fix the edges of walkways and driveways and fix any cracks.  Remove anything that might make you trip as you walk through a door, such as a raised step or threshold.  Trim any bushes or trees on the path to your home.  Use bright outdoor lighting.  Clear any walking paths of anything that might make someone trip, such as rocks or tools.  Regularly check to see if handrails are loose or broken. Make sure that both sides of any steps have handrails.  Any raised decks and porches should have guardrails on the edges.  Have any leaves, snow, or ice cleared regularly.  Use sand or salt on walking paths during winter.  Clean up any spills in your garage right away. This includes oil or grease spills. What can I do in the bathroom?  Use night lights.  Install grab bars by the toilet and in the tub and shower. Do not use towel bars as grab bars.  Use non-skid mats or decals in the tub or shower.  If  you need to sit down in the shower, use a plastic, non-slip stool.  Keep the floor dry. Clean up any water that spills on the floor as soon as it happens.  Remove soap buildup in the tub or shower regularly.  Attach bath mats securely with double-sided non-slip rug tape.  Do not have throw rugs and other things on the floor that can make you trip. What can I do in the bedroom?  Use night lights.  Make sure that you have a light by your bed that is easy to reach.  Do not use any sheets or blankets that are too big for your bed. They should not hang down onto the floor.  Have a firm chair that has side arms. You can use this for support while you get dressed.  Do not have throw rugs and other things on the floor that can make you trip. What can I do in the kitchen?  Clean up any spills right away.  Avoid walking on wet floors.  Keep items that you use a lot in easy-to-reach places.  If you need to reach something above you, use a strong step stool that has a grab bar.  Keep electrical cords out of the way.  Do not use floor  polish or wax that makes floors slippery. If you must use wax, use non-skid floor wax.  Do not have throw rugs and other things on the floor that can make you trip. What can I do with my stairs?  Do not leave any items on the stairs.  Make sure that there are handrails on both sides of the stairs and use them. Fix handrails that are broken or loose. Make sure that handrails are as long as the stairways.  Check any carpeting to make sure that it is firmly attached to the stairs. Fix any carpet that is loose or worn.  Avoid having throw rugs at the top or bottom of the stairs. If you do have throw rugs, attach them to the floor with carpet tape.  Make sure that you have a light switch at the top of the stairs and the bottom of the stairs. If you do not have them, ask someone to add them for you. What else can I do to help prevent falls?  Wear shoes  that:  Do not have high heels.  Have rubber bottoms.  Are comfortable and fit you well.  Are closed at the toe. Do not wear sandals.  If you use a stepladder:  Make sure that it is fully opened. Do not climb a closed stepladder.  Make sure that both sides of the stepladder are locked into place.  Ask someone to hold it for you, if possible.  Clearly mark and make sure that you can see:  Any grab bars or handrails.  First and last steps.  Where the edge of each step is.  Use tools that help you move around (mobility aids) if they are needed. These include:  Canes.  Walkers.  Scooters.  Crutches.  Turn on the lights when you go into a dark area. Replace any light bulbs as soon as they burn out.  Set up your furniture so you have a clear path. Avoid moving your furniture around.  If any of your floors are uneven, fix them.  If there are any pets around you, be aware of where they are.  Review your medicines with your doctor. Some medicines can make you feel dizzy. This can increase your chance of falling. Ask your doctor what other things that you can do to help prevent falls. This information is not intended to replace advice given to you by your health care provider. Make sure you discuss any questions you have with your health care provider. Document Released: 11/27/2008 Document Revised: 07/09/2015 Document Reviewed: 03/07/2014 Elsevier Interactive Patient Education  2017 [employer].

## 2016-12-08 NOTE — Progress Notes (Signed)
Subjective:   Belinda Sanchez is a 76 y.o. female who presents for Medicare Annual (Subsequent) preventive examination.  Review of Systems:  N/A Cardiac Risk Factors include: dyslipidemia;hypertension;advanced age (>6mn, >>13women);obesity (BMI >30kg/m2)     Objective:     Vitals: BP 124/82 (BP Location: Right Arm, Patient Position: Sitting, Cuff Size: Normal)   Pulse 60   Temp (!) 97.5 F (36.4 C) (Oral)   Resp 16   Ht 5' 4"  (1.626 m)   Wt 180 lb 6.4 oz (81.8 kg)   BMI 30.97 kg/m   Body mass index is 30.97 kg/m.   Tobacco History  Smoking Status  . Never Smoker  Smokeless Tobacco  . Never Used     Counseling given: Not Answered   Past Medical History:  Diagnosis Date  . Hyperlipidemia   . Hypertension   . Hypothyroid    Past Surgical History:  Procedure Laterality Date  . APPENDECTOMY    . BREAST EXCISIONAL BIOPSY Right 04/2014   benign  . COLONOSCOPY  08/2010   one polyp - repeat 5 yrs  . MASTECTOMY, RADICAL Left 2009  . SHOULDER SURGERY  2016   AVN  . VAGINAL HYSTERECTOMY     total   Family History  Problem Relation Age of Onset  . Hypertension Mother   . Diabetes Mother    History  Sexual Activity  . Sexual activity: Not on file    Outpatient Encounter Prescriptions as of 12/08/2016  Medication Sig  . ammonium lactate (LAC-HYDRIN) 12 % lotion 1 GM APPLY ON THE SKIN DAILY  . BYSTOLIC 10 MG tablet TAKE ONE TABLET BY MOUTH DAILY  . diltiazem (DILACOR XR) 240 MG 24 hr capsule Take 1 capsule (240 mg total) by mouth daily.  .Marland Kitchendiltiazem (TIAZAC) 240 MG 24 hr capsule TAKE 1 CAPSULE (240 MG TOTAL) BY MOUTH DAILY.  . Ergocalciferol (VITAMIN D2) 400 units TABS Take by mouth. Pt taking 5,000 units daily  . Flaxseed, Linseed, (FLAXSEED OIL) 1000 MG CAPS Take by mouth.  . levothyroxine (SYNTHROID, LEVOTHROID) 150 MCG tablet TAKE 1 TABLET BY MOUTH DAILY *TAKE A HALF TABLET ON MON/FRI*  . losartan (COZAAR) 50 MG tablet TAKE ONE TABLET BY MOUTH DAILY  .  Multiple Vitamins tablet Take by mouth.  . Omega-3 Fatty Acids (FISH OIL) 1000 MG CPDR Take by mouth.  . zolpidem (AMBIEN) 10 MG tablet TAKE ONE TABLET BY MOUTH AT BEDTIME   No facility-administered encounter medications on file as of 12/08/2016.     Activities of Daily Living In your present state of health, do you have any difficulty performing the following activities: 12/08/2016 03/07/2016  Hearing? N N  Vision? N N  Comment - wearing eye glasses  Difficulty concentrating or making decisions? N N  Walking or climbing stairs? N N  Dressing or bathing? N N  Doing errands, shopping? N N  Preparing Food and eating ? N N  Using the Toilet? N N  In the past six months, have you accidently leaked urine? N N  Do you have problems with loss of bowel control? N N  Managing your Medications? N N  Managing your Finances? N N  Housekeeping or managing your Housekeeping? N N  Some recent data might be hidden    Patient Care Team: BGlean Hess MD as PCP - General (Family Medicine) TComer LocketDRayvon Char(Inactive) as Referring Physician (Gastroenterology)    Assessment:     Exercise Activities and Dietary recommendations Current Exercise Habits:  Structured exercise class, Type of exercise: strength training/weights, Time (Minutes): 60, Frequency (Times/Week): 3, Weekly Exercise (Minutes/Week): 180, Intensity: Mild, Exercise limited by: None identified  Goals    . Exercise 150 minutes per week (moderate activity)          Get back to exercise that has been on hold since shoulder surgery    . Weight (lb) < 200 lb (90.7 kg)      Fall Risk Fall Risk  12/08/2016 03/07/2016 03/07/2016 08/31/2015 12/15/2014  Falls in the past year? No No No No No   Depression Screen PHQ 2/9 Scores 12/08/2016 03/07/2016 03/07/2016 08/31/2015  PHQ - 2 Score 0 0 0 0     Cognitive Function     6CIT Screen 12/08/2016 03/07/2016  What Year? 0 points 0 points  What month? 0 points 0 points  What time? 0  points 0 points  Count back from 20 0 points 0 points  Months in reverse 0 points 0 points  Repeat phrase 0 points 0 points  Total Score 0 0    Immunization History  Administered Date(s) Administered  . Influenza,inj,Quad PF,6+ Mos 12/15/2014, 10/26/2015  . Pneumococcal Conjugate-13 12/23/2013  . Pneumococcal Polysaccharide-23 02/16/2007, 12/10/2012  . Tdap 05/27/2013  . Zoster 02/15/2010   Screening Tests Health Maintenance  Topic Date Due  . INFLUENZA VACCINE  09/14/2016  . MAMMOGRAM  07/18/2017  . TETANUS/TDAP  05/28/2023  . COLONOSCOPY  12/13/2025  . DEXA SCAN  Addressed  . PNA vac Low Risk Adult  Completed      Plan:    I have personally reviewed and addressed the Medicare Annual Wellness questionnaire and have noted the following in the patient's chart:  A. Medical and social history B. Use of alcohol, tobacco or illicit drugs  C. Current medications and supplements D. Functional ability and status E.  Nutritional status F.  Physical activity G. Advance directives H. List of other physicians I.  Hospitalizations, surgeries, and ER visits in previous 12 months J.  [place] such as hearing and vision if needed, cognitive and depression L. Referrals and appointments - none  In addition, I have reviewed and discussed with patient certain preventive protocols, quality metrics, and best practice recommendations. A written personalized care plan for preventive services as well as general preventive health recommendations were provided to patient.  See attached scanned questionnaire for additional information.   Signed,  Aleatha Borer, LPN Nurse Health Advisor  MD Recommendations: Declined flu vaccine today. States she received the vaccine at [facility] in [place]. Pt also states she will complete the Shingrix series later this week. Advised pt to provide immunization records at her next OV.

## 2016-12-15 DIAGNOSIS — M21869 Other specified acquired deformities of unspecified lower leg: Secondary | ICD-10-CM | POA: Diagnosis not present

## 2016-12-15 DIAGNOSIS — R609 Edema, unspecified: Secondary | ICD-10-CM | POA: Diagnosis not present

## 2016-12-15 DIAGNOSIS — M79672 Pain in left foot: Secondary | ICD-10-CM | POA: Diagnosis not present

## 2016-12-17 DIAGNOSIS — M170 Bilateral primary osteoarthritis of knee: Secondary | ICD-10-CM | POA: Diagnosis not present

## 2016-12-19 DIAGNOSIS — M898X7 Other specified disorders of bone, ankle and foot: Secondary | ICD-10-CM | POA: Diagnosis not present

## 2016-12-20 ENCOUNTER — Ambulatory Visit (INDEPENDENT_AMBULATORY_CARE_PROVIDER_SITE_OTHER): Payer: Medicare Other | Admitting: Internal Medicine

## 2016-12-20 ENCOUNTER — Encounter: Payer: Self-pay | Admitting: Internal Medicine

## 2016-12-20 VITALS — BP 132/74 | HR 86 | Ht 64.0 in | Wt 180.1 lb

## 2016-12-20 DIAGNOSIS — M4696 Unspecified inflammatory spondylopathy, lumbar region: Secondary | ICD-10-CM

## 2016-12-20 DIAGNOSIS — M19072 Primary osteoarthritis, left ankle and foot: Secondary | ICD-10-CM | POA: Diagnosis not present

## 2016-12-20 DIAGNOSIS — M1711 Unilateral primary osteoarthritis, right knee: Secondary | ICD-10-CM | POA: Diagnosis not present

## 2016-12-20 NOTE — Progress Notes (Signed)
Date:  12/20/2016   Name:  Belinda Sanchez   DOB:  11/09/1940   MRN:  [id number]   Chief Complaint: Generalized Body Aches (Joints aching- been going on for a while. States some days are better than others. Today lower back is hurting. Had cortisone shots in knees Saturday- feeling better since. )  HPI Myalgias - in bilateral low back and SI region.  No pain or weakness.  Just feels like she is falling apart.  Foot pain - seen by podiatry for left foot pain.  Xray done yesterday showed only OA.  Knee OA - injected 3 days ago with cortisone.  Felt better the next day.  SR Left Foot: IMPRESSION: 1.Nonspecific calcifications and mild bony ridging without suspicious erosion along the lateral base of the fifth metatarsal, which may represent enthesophytes or sequelae from prior injury. 2.Adduction of the fourth and fifth toes at the DIP joint, nonspecific.No fractures or dislocations noted. 3.Additional incidental and chronic-appearing findings.  Review of Systems  Constitutional: Negative for chills, fatigue and fever.  Eyes: Negative for visual disturbance.  Respiratory: Negative for chest tightness and shortness of breath.   Cardiovascular: Negative for chest pain, palpitations and leg swelling.  Gastrointestinal: Negative for abdominal pain.  Musculoskeletal: Positive for arthralgias and back pain. Negative for gait problem.  Neurological: Negative for dizziness, tremors, weakness and headaches.  Hematological: Negative for adenopathy.  Psychiatric/Behavioral: Negative for decreased concentration. The patient is not nervous/anxious.     Patient Active Problem List   Diagnosis Date Noted  . 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 Herpes simplex infection 12/15/2014  . Acne erythematosa 12/03/2014  . Acquired hypothyroidism 12/03/2014  . CC (Crohn's colitis) ([place]) 12/03/2014  . Dyslipidemia 12/03/2014  . Essential (primary) hypertension  12/03/2014  . Idiopathic insomnia 12/03/2014  . Osteoarthritis of right knee 12/03/2014  . Avitaminosis D 12/03/2014  . Avascular necrosis of bone ([place]) 10/21/2014  . Malignant neoplasm of left female breast ([place]) 06/27/2014    Prior to Admission medications   Medication Sig Start Date End Date Taking? Authorizing Provider  Acetylcarnitine HCl (ACETYL-L-CARNITINE HCL) POWD by Does not apply route.   Yes [provider]  ammonium lactate (LAC-HYDRIN) 12 % lotion 1 GM APPLY ON THE SKIN DAILY 07/08/15  Yes [provider]  BYSTOLIC 10 MG tablet TAKE ONE TABLET BY MOUTH DAILY 11/19/16  Yes Glean Hess, MD  Chromium Picolinate 800 MCG TABS Take by mouth.   Yes [provider]  diltiazem (DILACOR XR) 240 MG 24 hr capsule Take 1 capsule (240 mg total) by mouth daily. 10/30/15  Yes Glean Hess, MD  Ergocalciferol (VITAMIN D2) 400 units TABS Take by mouth. Pt taking 5,000 units daily   Yes [provider]  Flaxseed, Linseed, (FLAXSEED OIL) 1000 MG CAPS Take by mouth.   Yes [provider]  L-Citrulline POWD by Does not apply route.   Yes [provider]  levothyroxine (SYNTHROID, LEVOTHROID) 150 MCG tablet TAKE 1 TABLET BY MOUTH DAILY *TAKE A HALF TABLET ON MON/FRI* 11/20/16  Yes Glean Hess, MD  losartan (COZAAR) 50 MG tablet TAKE ONE TABLET BY MOUTH DAILY 11/19/16  Yes Glean Hess, MD  Multiple Vitamins tablet Take by mouth.   Yes [provider]  Omega-3 Fatty Acids (FISH OIL) 1000 MG CPDR Take by mouth.   Yes [provider]  vitamin E 100 UNIT capsule Take daily by mouth.   Yes [provider]  zolpidem (  AMBIEN) 10 MG tablet TAKE ONE TABLET BY MOUTH AT BEDTIME 11/24/16  Yes Glean Hess, MD    Allergies  Allergen Reactions  . Lozol  [Indapamide]     elevated serum calcium  . Loratadine Palpitations    Past Surgical History:  Procedure Laterality Date  . APPENDECTOMY    . BREAST  EXCISIONAL BIOPSY Right 04/2014   benign  . COLONOSCOPY  08/2010   one polyp - repeat 5 yrs  . MASTECTOMY, RADICAL Left 2009  . SHOULDER SURGERY  2016   AVN  . VAGINAL HYSTERECTOMY     total    Social History   Tobacco Use  . Smoking status: Never Smoker  . Smokeless tobacco: Never Used  Substance Use Topics  . Alcohol use: Yes    Alcohol/week: 2.4 oz    Types: 2 Standard drinks or equivalent, 2 Glasses of wine per week  . Drug use: No     Medication list has been reviewed and updated.  PHQ 2/9 Scores 12/08/2016 03/07/2016 03/07/2016 08/31/2015  PHQ - 2 Score 0 0 0 0    Physical Exam  Constitutional: She is oriented to person, place, and time. She appears well-developed. No distress.  HENT:  Head: Normocephalic and atraumatic.  Neck: Normal range of motion. Neck supple. No thyromegaly present.  Cardiovascular: Normal rate, regular rhythm and normal heart sounds.  Pulmonary/Chest: Effort normal and breath sounds normal. No respiratory distress. She has no wheezes.  Musculoskeletal: Normal range of motion. She exhibits no edema.       Right shoulder: Normal.       Left shoulder: Normal.       Lumbar back: She exhibits tenderness. She exhibits no bony tenderness, no swelling and no edema.  Neurological: She is alert and oriented to person, place, and time. She has normal strength and normal reflexes. No sensory deficit. Gait normal.  Negative SLR bilaterally No muscle tenderness or spasm noted in neck, shoulders, or upper arms  Skin: Skin is warm and dry. No rash noted.  Psychiatric: She has a normal mood and affect. Her behavior is normal. Thought content normal.  Nursing note and vitals reviewed.   BP 132/74   Pulse 86   Ht 5' 4"  (1.626 m)   Wt 180 lb 1.6 oz (81.7 kg)   SpO2 95%   BMI 30.91 kg/m   Assessment and Plan: 1. 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 Primary osteoarthritis of right knee Seen by Ortho  - doing well after cortisone injections  3. Arthritis of foot, left Improved after taping Follow up with Podiatry if needed   No orders of the defined types were placed in this encounter.   Partially dictated using Editor, commissioning. Any errors are unintentional.  Halina Maidens, MD [facility]  12/20/2016

## 2017-02-18 DIAGNOSIS — M19042 Primary osteoarthritis, left hand: Secondary | ICD-10-CM | POA: Diagnosis not present

## 2017-02-18 DIAGNOSIS — M79645 Pain in left finger(s): Secondary | ICD-10-CM | POA: Diagnosis not present

## 2017-02-18 DIAGNOSIS — M170 Bilateral primary osteoarthritis of knee: Secondary | ICD-10-CM | POA: Diagnosis not present

## 2017-02-18 DIAGNOSIS — M1812 Unilateral primary osteoarthritis of first carpometacarpal joint, left hand: Secondary | ICD-10-CM | POA: Diagnosis not present

## 2017-03-08 ENCOUNTER — Encounter: Payer: Self-pay | Admitting: Internal Medicine

## 2017-03-08 ENCOUNTER — Ambulatory Visit (INDEPENDENT_AMBULATORY_CARE_PROVIDER_SITE_OTHER): Payer: Medicare Other | Admitting: Internal Medicine

## 2017-03-08 VITALS — BP 118/78 | HR 52 | Ht 64.0 in | Wt 178.0 lb

## 2017-03-08 DIAGNOSIS — Z853 Personal history of malignant neoplasm of breast: Secondary | ICD-10-CM | POA: Diagnosis not present

## 2017-03-08 DIAGNOSIS — E039 Hypothyroidism, unspecified: Secondary | ICD-10-CM | POA: Diagnosis not present

## 2017-03-08 DIAGNOSIS — F5101 Primary insomnia: Secondary | ICD-10-CM | POA: Diagnosis not present

## 2017-03-08 DIAGNOSIS — I10 Essential (primary) hypertension: Secondary | ICD-10-CM

## 2017-03-08 DIAGNOSIS — K5010 Crohn's disease of large intestine without complications: Secondary | ICD-10-CM

## 2017-03-08 DIAGNOSIS — M4696 Unspecified inflammatory spondylopathy, lumbar region: Secondary | ICD-10-CM | POA: Diagnosis not present

## 2017-03-08 DIAGNOSIS — E785 Hyperlipidemia, unspecified: Secondary | ICD-10-CM

## 2017-03-08 LAB — POCT URINALYSIS DIPSTICK
Bilirubin, UA: NEGATIVE
Blood, UA: NEGATIVE
Glucose, UA: NEGATIVE
Ketones, UA: NEGATIVE
LEUKOCYTES UA: NEGATIVE
NITRITE UA: NEGATIVE
PROTEIN UA: NEGATIVE
SPEC GRAV UA: 1.015 (ref 1.010–1.025)
Urobilinogen, UA: 0.2 E.U./dL
pH, UA: 7.5 (ref 5.0–8.0)

## 2017-03-08 MED ORDER — ZOLPIDEM TARTRATE 10 MG PO TABS: 10.0000 mg | ORAL_TABLET | Freq: Every day | ORAL | 1 refills | Status: DC

## 2017-03-08 NOTE — Progress Notes (Signed)
Date:  03/08/2017   Name:  Belinda Sanchez   DOB:  March 01, 1940   MRN:  [id number]   Chief Complaint: Annual Exam (Breast Exam. ); Hypertension; Hypothyroidism; and Insomnia  Belinda Sanchez is a 77 y.o. female who presents today for her Complete Annual Exam. She feels well. She reports exercising regularly. She reports she is sleeping well with medication.   Hypertension  This is a chronic problem. The problem is controlled. Pertinent negatives include no chest pain, headaches, palpitations or shortness of breath. Past treatments include calcium channel blockers, beta blockers and angiotensin blockers. The current treatment provides significant improvement. Identifiable causes of hypertension include a thyroid problem.  Thyroid Problem  Presents for follow-up visit. Patient reports no anxiety, constipation, diarrhea, fatigue, palpitations or tremors. The symptoms have been stable.  Insomnia  Primary symptoms: sleep disturbance.  The problem occurs nightly. The symptoms are relieved by medication.   Crohn's disease - no issue in years, since chemotherapy. Last colonoscopy was in 2017 - normal.  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  Knee and ankle pain - getting knee injections starting next month.  Aleve helps with pain.  Also foot pain, especially in the morning.  She also has joint pain in left thumb due to loss of cartilage.  She will continue with Ortho for knees and consider seeing them for foot and thumb pain as well.  Review of Systems  Constitutional: Negative for chills, fatigue and fever.  HENT: Negative for congestion, hearing loss, tinnitus, trouble swallowing and voice change.   Eyes: Negative for visual disturbance.  Respiratory: Negative for cough, chest tightness, shortness of breath and wheezing.   Cardiovascular: Negative for chest pain, palpitations and leg swelling.  Gastrointestinal: Negative for abdominal pain, constipation, diarrhea and vomiting.    Endocrine: Negative for polydipsia and polyuria.  Genitourinary: Negative for dysuria, frequency, genital sores, vaginal bleeding and vaginal discharge.  Musculoskeletal: Positive for arthralgias (knees, feet, thumb) and gait problem. Negative for joint swelling.  Skin: Negative for color change and rash.  Neurological: Negative for dizziness, tremors, light-headedness and headaches.  Hematological: Negative for adenopathy. Does not bruise/bleed easily.  Psychiatric/Behavioral: Positive for sleep disturbance. Negative for dysphoric mood. The patient has insomnia. The patient is not nervous/anxious.     Patient Active Problem List   Diagnosis Date Noted  . Arthritis of foot, left 12/20/2016  . Inflammatory spondylopathy of lumbar region ([place]) 08/08/2015  . Herpes simplex infection 12/15/2014  . Acne erythematosa 12/03/2014  . Acquired hypothyroidism 12/03/2014  . CC (Crohn's colitis) ([place]) 12/03/2014  . Dyslipidemia 12/03/2014  . Essential (primary) hypertension 12/03/2014  . Idiopathic insomnia 12/03/2014  . Osteoarthritis of right knee 12/03/2014  . Avitaminosis D 12/03/2014  . History of breast cancer 06/27/2014    Prior to Admission medications   Medication Sig Start Date End Date Taking? Authorizing Provider  Acetylcarnitine HCl (ACETYL-L-CARNITINE HCL) POWD by Does not apply route.    [provider]  ammonium lactate (LAC-HYDRIN) 12 % lotion 1 GM APPLY ON THE SKIN DAILY 07/08/15   [provider]  BYSTOLIC 10 MG tablet TAKE ONE TABLET BY MOUTH DAILY 11/19/16   Glean Hess, MD  Chromium Picolinate 800 MCG TABS Take by mouth.    [provider]  diltiazem (DILACOR XR) 240 MG 24 hr capsule Take 1 capsule (240 mg total) by mouth daily. 10/30/15   Glean Hess, MD  Ergocalciferol (VITAMIN D2) 400 units TABS Take by mouth. Pt taking 5,000  units daily    [provider]  Flaxseed, Linseed, (FLAXSEED OIL) 1000 MG CAPS Take by mouth.     [provider]  L-Citrulline POWD by Does not apply route.    [provider]  levothyroxine (SYNTHROID, LEVOTHROID) 150 MCG tablet TAKE 1 TABLET BY MOUTH DAILY *TAKE A HALF TABLET ON MON/FRI* 11/20/16   Glean Hess, MD  losartan (COZAAR) 50 MG tablet TAKE ONE TABLET BY MOUTH DAILY 11/19/16   Glean Hess, MD  Multiple Vitamins tablet Take by mouth.    [provider]  Omega-3 Fatty Acids (FISH OIL) 1000 MG CPDR Take by mouth.    [provider]  vitamin E 100 UNIT capsule Take daily by mouth.    [provider]  zolpidem (AMBIEN) 10 MG tablet TAKE ONE TABLET BY MOUTH AT BEDTIME 11/24/16   Glean Hess, MD    Allergies  Allergen Reactions  . Lozol  [Indapamide]     elevated serum calcium  . Loratadine Palpitations    Past Surgical History:  Procedure Laterality Date  . APPENDECTOMY    . BREAST EXCISIONAL BIOPSY Right 04/2014   benign  . COLONOSCOPY  08/2010   one polyp - repeat 5 yrs  . MASTECTOMY, RADICAL Left 2009  . SHOULDER SURGERY  2016   AVN  . VAGINAL HYSTERECTOMY     total    Social History   Tobacco Use  . Smoking status: Never Smoker  . Smokeless tobacco: Never Used  Substance Use Topics  . Alcohol use: Yes    Alcohol/week: 2.4 oz    Types: 2 Standard drinks or equivalent, 2 Glasses of wine per week  . Drug use: No     Medication list has been reviewed and updated.  PHQ 2/9 Scores 12/08/2016 03/07/2016 03/07/2016 08/31/2015  PHQ - 2 Score 0 0 0 0    Physical Exam  Constitutional: She is oriented to person, place, and time. She appears well-developed and well-nourished. No distress.  HENT:  Head: Normocephalic and atraumatic.  Right Ear: Tympanic membrane and ear canal normal.  Left Ear: Tympanic membrane and ear canal normal.  Nose: Right sinus exhibits no maxillary sinus tenderness. Left sinus exhibits no maxillary sinus tenderness.  Mouth/Throat: Uvula is midline and oropharynx is clear and  moist.  Eyes: Conjunctivae and EOM are normal. Right eye exhibits no discharge. Left eye exhibits no discharge. No scleral icterus.  Neck: Normal range of motion. Carotid bruit is not present. No erythema present. No thyromegaly present.  Cardiovascular: Normal rate, regular rhythm, normal heart sounds and normal pulses.  Pulmonary/Chest: Effort normal. No respiratory distress. She has no wheezes. Right breast exhibits no mass, no nipple discharge, no skin change and no tenderness.    Abdominal: Soft. Bowel sounds are normal. There is no hepatosplenomegaly. There is no tenderness. There is no CVA tenderness.  Musculoskeletal: Normal range of motion.  Lymphadenopathy:    She has no cervical adenopathy.    She has no axillary adenopathy.  Neurological: She is alert and oriented to person, place, and time. She has normal reflexes. No cranial nerve deficit or sensory deficit.  Skin: Skin is warm, dry and intact. No rash noted.  Psychiatric: She has a normal mood and affect. Her speech is normal and behavior is normal. Thought content normal.  Nursing note and vitals reviewed.   BP 118/78   Pulse (!) 52   Ht 5' 4"  (1.626 m)   Wt 178 lb (80.7 kg)  SpO2 94%   BMI 30.55 kg/m   Assessment and Plan: 1. Essential (primary) hypertension controlled - CBC with Differential/Platelet - Comprehensive metabolic panel - POCT urinalysis dipstick  2. Acquired hypothyroidism supplemented - TSH  3. Dyslipidemia On diet and exercise plus Omega-3 - Lipid panel  4. Idiopathic insomnia - zolpidem (AMBIEN) 10 MG tablet; Take 1 tablet (10 mg total) by mouth at bedtime.  Dispense: 90 tablet; Refill: 1  5. Crohn's disease of colon without complication ([place]) asymptomatic  6. Inflammatory spondylopathy of lumbar region [facility]) Not currently active  7. History of breast cancer Rx for prosthesis and bras given - MM Digital Diagnostic Unilat R   Meds ordered this encounter  Medications  .  zolpidem (AMBIEN) 10 MG tablet    Sig: Take 1 tablet (10 mg total) by mouth at bedtime.    Dispense:  90 tablet    Refill:  1    Partially dictated using Editor, commissioning. Any errors are unintentional.  Halina Maidens, MD [facility]  03/08/2017

## 2017-03-09 LAB — CBC WITH DIFFERENTIAL/PLATELET
BASOS ABS: 0.1 10*3/uL (ref 0.0–0.2)
Basos: 1 %
EOS (ABSOLUTE): 0.2 10*3/uL (ref 0.0–0.4)
Eos: 3 %
HEMOGLOBIN: 15 g/dL (ref 11.1–15.9)
Hematocrit: 43.8 % (ref 34.0–46.6)
Immature Grans (Abs): 0 10*3/uL (ref 0.0–0.1)
Immature Granulocytes: 0 %
LYMPHS ABS: 2.3 10*3/uL (ref 0.7–3.1)
LYMPHS: 28 %
MCH: 32.9 pg (ref 26.6–33.0)
MCHC: 34.2 g/dL (ref 31.5–35.7)
MCV: 96 fL (ref 79–97)
MONOCYTES: 9 %
Monocytes Absolute: 0.7 10*3/uL (ref 0.1–0.9)
Neutrophils Absolute: 4.7 10*3/uL (ref 1.4–7.0)
Neutrophils: 59 %
Platelets: 205 10*3/uL (ref 150–379)
RBC: 4.56 x10E6/uL (ref 3.77–5.28)
RDW: 12.7 % (ref 12.3–15.4)
WBC: 8 10*3/uL (ref 3.4–10.8)

## 2017-03-09 LAB — COMPREHENSIVE METABOLIC PANEL
ALBUMIN: 4.5 g/dL (ref 3.5–4.8)
ALK PHOS: 115 IU/L (ref 39–117)
ALT: 43 IU/L — AB (ref 0–32)
AST: 48 IU/L — AB (ref 0–40)
Albumin/Globulin Ratio: 1.7 (ref 1.2–2.2)
BUN / CREAT RATIO: 21 (ref 12–28)
BUN: 17 mg/dL (ref 8–27)
Bilirubin Total: 0.7 mg/dL (ref 0.0–1.2)
CO2: 27 mmol/L (ref 20–29)
CREATININE: 0.82 mg/dL (ref 0.57–1.00)
Calcium: 9.7 mg/dL (ref 8.7–10.3)
Chloride: 100 mmol/L (ref 96–106)
GFR calc Af Amer: 82 mL/min/{1.73_m2} (ref >59)
GFR calc non Af Amer: 71 mL/min/{1.73_m2} (ref >59)
GLUCOSE: 107 mg/dL — AB (ref 65–99)
Globulin, Total: 2.6 g/dL (ref 1.5–4.5)
Potassium: 4.7 mmol/L (ref 3.5–5.2)
Sodium: 144 mmol/L (ref 134–144)
Total Protein: 7.1 g/dL (ref 6.0–8.5)

## 2017-03-09 LAB — LIPID PANEL
Chol/HDL Ratio: 3.3 ratio (ref 0.0–4.4)
Cholesterol, Total: 253 mg/dL — ABNORMAL HIGH (ref 100–199)
HDL: 76 mg/dL (ref >39)
LDL Calculated: 153 mg/dL — ABNORMAL HIGH (ref 0–99)
Triglycerides: 122 mg/dL (ref 0–149)
VLDL CHOLESTEROL CAL: 24 mg/dL (ref 5–40)

## 2017-03-09 LAB — TSH: TSH: 6.72 u[IU]/mL — AB (ref 0.450–4.500)

## 2017-03-10 ENCOUNTER — Encounter: Payer: Self-pay | Admitting: Internal Medicine

## 2017-03-13 ENCOUNTER — Encounter: Payer: Self-pay | Admitting: Internal Medicine

## 2017-03-13 ENCOUNTER — Other Ambulatory Visit: Payer: Self-pay | Admitting: Internal Medicine

## 2017-03-13 DIAGNOSIS — E039 Hypothyroidism, unspecified: Secondary | ICD-10-CM

## 2017-03-13 MED ORDER — LEVOTHYROXINE SODIUM 175 MCG PO TABS: 175.0000 ug | ORAL_TABLET | Freq: Every day | ORAL | 3 refills | Status: DC

## 2017-03-13 NOTE — Telephone Encounter (Signed)
Patient needs increased rx sent to pharmacy. She is taking the whole pill of 150 mcg.

## 2017-03-16 DIAGNOSIS — M21611 Bunion of right foot: Secondary | ICD-10-CM | POA: Diagnosis not present

## 2017-03-16 DIAGNOSIS — M79671 Pain in right foot: Secondary | ICD-10-CM | POA: Diagnosis not present

## 2017-03-16 DIAGNOSIS — M7742 Metatarsalgia, left foot: Secondary | ICD-10-CM | POA: Diagnosis not present

## 2017-03-16 DIAGNOSIS — M2580 Other specified joint disorders, unspecified joint: Secondary | ICD-10-CM | POA: Diagnosis not present

## 2017-03-16 DIAGNOSIS — M79672 Pain in left foot: Secondary | ICD-10-CM | POA: Diagnosis not present

## 2017-03-16 DIAGNOSIS — M7741 Metatarsalgia, right foot: Secondary | ICD-10-CM | POA: Diagnosis not present

## 2017-03-16 DIAGNOSIS — M19071 Primary osteoarthritis, right ankle and foot: Secondary | ICD-10-CM | POA: Diagnosis not present

## 2017-03-16 DIAGNOSIS — M19072 Primary osteoarthritis, left ankle and foot: Secondary | ICD-10-CM | POA: Diagnosis not present

## 2017-03-21 DIAGNOSIS — M170 Bilateral primary osteoarthritis of knee: Secondary | ICD-10-CM | POA: Diagnosis not present

## 2017-03-28 DIAGNOSIS — M170 Bilateral primary osteoarthritis of knee: Secondary | ICD-10-CM | POA: Diagnosis not present

## 2017-04-04 DIAGNOSIS — M170 Bilateral primary osteoarthritis of knee: Secondary | ICD-10-CM | POA: Diagnosis not present

## 2017-04-11 DIAGNOSIS — K5792 Diverticulitis of intestine, part unspecified, without perforation or abscess without bleeding: Secondary | ICD-10-CM | POA: Diagnosis not present

## 2017-04-11 DIAGNOSIS — R1032 Left lower quadrant pain: Secondary | ICD-10-CM | POA: Diagnosis not present

## 2017-05-02 ENCOUNTER — Encounter: Payer: Self-pay | Admitting: Internal Medicine

## 2017-05-18 DIAGNOSIS — M76822 Posterior tibial tendinitis, left leg: Secondary | ICD-10-CM | POA: Diagnosis not present

## 2017-05-18 DIAGNOSIS — M76821 Posterior tibial tendinitis, right leg: Secondary | ICD-10-CM | POA: Diagnosis not present

## 2017-05-26 ENCOUNTER — Telehealth: Payer: Self-pay

## 2017-05-26 NOTE — Telephone Encounter (Signed)
Called to reschedule 12/14/17 AWV. Program no longer available on Thursdays. LVM requesting returned call.

## 2017-05-29 NOTE — Telephone Encounter (Signed)
Called to reschedule 12/14/17 AWV. Program no longer available on Thursdays. LVM requesting returned call.

## 2017-05-30 DIAGNOSIS — R1032 Left lower quadrant pain: Secondary | ICD-10-CM | POA: Diagnosis not present

## 2017-06-29 DIAGNOSIS — L570 Actinic keratosis: Secondary | ICD-10-CM | POA: Diagnosis not present

## 2017-07-13 ENCOUNTER — Ambulatory Visit: Payer: Medicare Other | Admitting: Internal Medicine

## 2017-07-18 ENCOUNTER — Ambulatory Visit: Payer: Medicare Other | Admitting: Internal Medicine

## 2017-07-20 DIAGNOSIS — Z853 Personal history of malignant neoplasm of breast: Secondary | ICD-10-CM | POA: Diagnosis not present

## 2017-07-20 DIAGNOSIS — Z1231 Encounter for screening mammogram for malignant neoplasm of breast: Secondary | ICD-10-CM | POA: Diagnosis not present

## 2017-08-09 ENCOUNTER — Encounter: Payer: Self-pay | Admitting: Internal Medicine

## 2017-08-09 ENCOUNTER — Ambulatory Visit (INDEPENDENT_AMBULATORY_CARE_PROVIDER_SITE_OTHER): Payer: Medicare Other | Admitting: Internal Medicine

## 2017-08-09 VITALS — BP 126/82 | HR 49 | Temp 98.1°F | Resp 16 | Ht 64.0 in

## 2017-08-09 DIAGNOSIS — E039 Hypothyroidism, unspecified: Secondary | ICD-10-CM | POA: Diagnosis not present

## 2017-08-09 DIAGNOSIS — R748 Abnormal levels of other serum enzymes: Secondary | ICD-10-CM | POA: Diagnosis not present

## 2017-08-09 DIAGNOSIS — M129 Arthropathy, unspecified: Secondary | ICD-10-CM

## 2017-08-09 DIAGNOSIS — I10 Essential (primary) hypertension: Secondary | ICD-10-CM

## 2017-08-09 NOTE — Progress Notes (Signed)
Date:  08/09/2017   Name:  Belinda Sanchez   DOB:  19-Jan-1941   MRN:  [id number]   Chief Complaint: Abdominal Pain (bloating, gas, ) and Arthritis (joints )  Arthritis - mostly in hands, fingers and wrists.  Take Aleve 220 mg three or four times per week.  She denies redness but does have some swelling of fingers.  She uses her wrists a lot at work which has aggravated her sx.  Last week she had some abdominal discomfort in the LLQ and some bloating but no fever or diarrhea.  She took a probiotic and now sx have resolved.  She was concerned it might be diverticulitis.  Thyroid - last visit TSH was elevated so dose was increased.  Due for recheck.  She does not feel any different - no weight gain, change in energy, etc.  HTN - controlled on current regimen.  Elevated LFTs - noted mild increase last visit.  Need to recheck today.  Has history of fatty liver.  Review of Systems  Constitutional: Negative for chills, fatigue and fever.  Respiratory: Negative for cough, chest tightness and shortness of breath.   Cardiovascular: Negative for palpitations and leg swelling.  Gastrointestinal: Negative for abdominal distention, anal bleeding, blood in stool, constipation and diarrhea.  Musculoskeletal: Positive for arthralgias. Negative for gait problem and myalgias.    Patient Active Problem List   Diagnosis Date Noted  . Arthritis of foot, left 12/20/2016  . Inflammatory spondylopathy of lumbar region ([place]) 08/08/2015  . Herpes simplex infection 12/15/2014  . Acne erythematosa 12/03/2014  . Acquired hypothyroidism 12/03/2014  . CC (Crohn's colitis) ([place]) 12/03/2014  . Dyslipidemia 12/03/2014  . Essential (primary) hypertension 12/03/2014  . Idiopathic insomnia 12/03/2014  . Osteoarthritis of right knee 12/03/2014  . Avitaminosis D 12/03/2014  . History of breast cancer 06/27/2014    Prior to Admission medications   Medication Sig Start Date End Date Taking? Authorizing Provider    Acetylcarnitine HCl (ACETYL-L-CARNITINE HCL) POWD by Does not apply route.   Yes [provider]  ammonium lactate (LAC-HYDRIN) 12 % lotion 1 GM APPLY ON THE SKIN DAILY 07/08/15  Yes [provider]  BYSTOLIC 10 MG tablet TAKE ONE TABLET BY MOUTH DAILY 11/19/16  Yes Glean Hess, MD  Chromium Picolinate 800 MCG TABS Take by mouth.   Yes [provider]  diltiazem (DILACOR XR) 240 MG 24 hr capsule Take 1 capsule (240 mg total) by mouth daily. 10/30/15  Yes Glean Hess, MD  Ergocalciferol (VITAMIN D2) 400 units TABS Take by mouth. Pt taking 5,000 units daily   Yes [provider]  Flaxseed, Linseed, (FLAXSEED OIL) 1000 MG CAPS Take by mouth.   Yes [provider]  L-Citrulline POWD by Does not apply route.   Yes [provider]  levothyroxine (SYNTHROID, LEVOTHROID) 175 MCG tablet Take 1 tablet (175 mcg total) by mouth daily before breakfast. 03/13/17  Yes Glean Hess, MD  losartan (COZAAR) 50 MG tablet TAKE ONE TABLET BY MOUTH DAILY 11/19/16  Yes Glean Hess, MD  Multiple Vitamins tablet Take by mouth.   Yes [provider]  Omega-3 Fatty Acids (FISH OIL) 1000 MG CPDR Take by mouth.   Yes [provider]  vitamin E 100 UNIT capsule Take daily by mouth.   Yes [provider]  zolpidem (AMBIEN) 10 MG tablet Take 1 tablet (10 mg total) by mouth at bedtime. 03/08/17  Yes Glean Hess, MD  Allergies  Allergen Reactions  . Lozol  [Indapamide]     elevated serum calcium  . Loratadine Palpitations    Past Surgical History:  Procedure Laterality Date  . APPENDECTOMY    . BREAST EXCISIONAL BIOPSY Right 04/2014   benign  . COLONOSCOPY  08/2010   one polyp - repeat 5 yrs  . MASTECTOMY, RADICAL Left 2009  . SHOULDER SURGERY  2016   AVN  . VAGINAL HYSTERECTOMY     total    Social History   Tobacco Use  . Smoking status: Never Smoker  . Smokeless tobacco: Never Used  Substance Use Topics   . Alcohol use: Yes    Alcohol/week: 2.4 oz    Types: 2 Standard drinks or equivalent, 2 Glasses of wine per week  . Drug use: No     Medication list has been reviewed and updated.  Current Meds  Medication Sig  . Acetylcarnitine HCl (ACETYL-L-CARNITINE HCL) POWD by Does not apply route.  Marland Kitchen ammonium lactate (LAC-HYDRIN) 12 % lotion 1 GM APPLY ON THE SKIN DAILY  . BYSTOLIC 10 MG tablet TAKE ONE TABLET BY MOUTH DAILY  . Chromium Picolinate 800 MCG TABS Take by mouth.  . diltiazem (DILACOR XR) 240 MG 24 hr capsule Take 1 capsule (240 mg total) by mouth daily.  . Ergocalciferol (VITAMIN D2) 400 units TABS Take by mouth. Pt taking 5,000 units daily  . Flaxseed, Linseed, (FLAXSEED OIL) 1000 MG CAPS Take by mouth.  Marland Kitchen L-Citrulline POWD by Does not apply route.  Marland Kitchen levothyroxine (SYNTHROID, LEVOTHROID) 175 MCG tablet Take 1 tablet (175 mcg total) by mouth daily before breakfast.  . losartan (COZAAR) 50 MG tablet TAKE ONE TABLET BY MOUTH DAILY  . Multiple Vitamins tablet Take by mouth.  . Omega-3 Fatty Acids (FISH OIL) 1000 MG CPDR Take by mouth.  . vitamin E 100 UNIT capsule Take daily by mouth.  . zolpidem (AMBIEN) 10 MG tablet Take 1 tablet (10 mg total) by mouth at bedtime.    PHQ 2/9 Scores 08/09/2017 12/08/2016 03/07/2016 03/07/2016  PHQ - 2 Score 0 0 0 0    Physical Exam  Constitutional: She is oriented to person, place, and time. She appears well-developed. No distress.  HENT:  Head: Normocephalic and atraumatic.  Neck: No thyroid mass and no thyromegaly present.  Cardiovascular: Normal rate, regular rhythm and normal heart sounds.  Pulmonary/Chest: Effort normal. No respiratory distress.  Abdominal: Soft. Normal appearance and bowel sounds are normal. There is no tenderness.  Musculoskeletal: Normal range of motion.  Neurological: She is alert and oriented to person, place, and time.  Skin: Skin is warm and dry. No rash noted.  Psychiatric: She has a normal mood and affect. Her  behavior is normal. Thought content normal.  Nursing note and vitals reviewed.   BP 126/82   Pulse (!) 49   Temp 98.1 F (36.7 C)   Resp 16   SpO2 97%   Assessment and Plan: 1. Acquired hypothyroidism Check labs - TSH  2. Arthritis involving multiple sites Continue Aleve PRN  3. Essential (primary) hypertension controlled  4. Elevated liver enzymes Recheck labs - Hepatic function panel   No orders of the defined types were placed in this encounter.   Partially dictated using Editor, commissioning. Any errors are unintentional.  Halina Maidens, MD [facility]  08/09/2017   There are no diagnoses linked to this encounter.

## 2017-08-10 LAB — HEPATIC FUNCTION PANEL
ALBUMIN: 4.5 g/dL (ref 3.5–4.8)
ALT: 40 IU/L — ABNORMAL HIGH (ref 0–32)
AST: 40 IU/L (ref 0–40)
Alkaline Phosphatase: 122 IU/L — ABNORMAL HIGH (ref 39–117)
Bilirubin Total: 0.5 mg/dL (ref 0.0–1.2)
Bilirubin, Direct: 0.16 mg/dL (ref 0.00–0.40)
TOTAL PROTEIN: 7.2 g/dL (ref 6.0–8.5)

## 2017-08-10 LAB — TSH: TSH: 1.02 u[IU]/mL (ref 0.450–4.500)

## 2017-08-22 DIAGNOSIS — M170 Bilateral primary osteoarthritis of knee: Secondary | ICD-10-CM | POA: Diagnosis not present

## 2017-09-22 DIAGNOSIS — R05 Cough: Secondary | ICD-10-CM | POA: Diagnosis not present

## 2017-09-22 DIAGNOSIS — J069 Acute upper respiratory infection, unspecified: Secondary | ICD-10-CM | POA: Diagnosis not present

## 2017-09-22 DIAGNOSIS — I10 Essential (primary) hypertension: Secondary | ICD-10-CM | POA: Diagnosis not present

## 2017-09-22 DIAGNOSIS — R112 Nausea with vomiting, unspecified: Secondary | ICD-10-CM | POA: Diagnosis not present

## 2017-11-12 ENCOUNTER — Other Ambulatory Visit: Payer: Self-pay | Admitting: Internal Medicine

## 2017-11-14 DIAGNOSIS — Z23 Encounter for immunization: Secondary | ICD-10-CM | POA: Diagnosis not present

## 2017-11-15 ENCOUNTER — Other Ambulatory Visit: Payer: Self-pay | Admitting: Internal Medicine

## 2017-11-15 DIAGNOSIS — F5101 Primary insomnia: Secondary | ICD-10-CM

## 2017-11-15 DIAGNOSIS — I10 Essential (primary) hypertension: Secondary | ICD-10-CM

## 2017-12-11 ENCOUNTER — Ambulatory Visit: Payer: Medicare Other

## 2017-12-14 ENCOUNTER — Ambulatory Visit: Payer: Medicare Other

## 2017-12-19 DIAGNOSIS — M170 Bilateral primary osteoarthritis of knee: Secondary | ICD-10-CM | POA: Diagnosis not present

## 2018-01-09 DIAGNOSIS — D692 Other nonthrombocytopenic purpura: Secondary | ICD-10-CM | POA: Diagnosis not present

## 2018-01-09 DIAGNOSIS — L821 Other seborrheic keratosis: Secondary | ICD-10-CM | POA: Diagnosis not present

## 2018-02-13 ENCOUNTER — Other Ambulatory Visit: Payer: Self-pay | Admitting: Internal Medicine

## 2018-02-13 DIAGNOSIS — F5101 Primary insomnia: Secondary | ICD-10-CM

## 2018-03-07 ENCOUNTER — Ambulatory Visit: Payer: Medicare Other

## 2018-03-13 ENCOUNTER — Encounter: Payer: Self-pay | Admitting: Internal Medicine

## 2018-03-13 ENCOUNTER — Ambulatory Visit (INDEPENDENT_AMBULATORY_CARE_PROVIDER_SITE_OTHER): Payer: Medicare Other | Admitting: Internal Medicine

## 2018-03-13 ENCOUNTER — Other Ambulatory Visit: Payer: Self-pay

## 2018-03-13 VITALS — BP 126/84 | HR 45 | Ht 64.0 in | Wt 173.0 lb

## 2018-03-13 DIAGNOSIS — K5733 Diverticulitis of large intestine without perforation or abscess with bleeding: Secondary | ICD-10-CM

## 2018-03-13 DIAGNOSIS — Z1231 Encounter for screening mammogram for malignant neoplasm of breast: Secondary | ICD-10-CM

## 2018-03-13 DIAGNOSIS — F5101 Primary insomnia: Secondary | ICD-10-CM

## 2018-03-13 DIAGNOSIS — I10 Essential (primary) hypertension: Secondary | ICD-10-CM

## 2018-03-13 DIAGNOSIS — E039 Hypothyroidism, unspecified: Secondary | ICD-10-CM | POA: Diagnosis not present

## 2018-03-13 LAB — POCT URINALYSIS DIPSTICK
BILIRUBIN UA: NEGATIVE
Blood, UA: NEGATIVE
Glucose, UA: NEGATIVE
KETONES UA: NEGATIVE
Leukocytes, UA: NEGATIVE
Nitrite, UA: NEGATIVE
Protein, UA: NEGATIVE
SPEC GRAV UA: 1.015 (ref 1.010–1.025)
UROBILINOGEN UA: 0.2 U/dL
pH, UA: 6.5 (ref 5.0–8.0)

## 2018-03-13 MED ORDER — AMOXICILLIN-POT CLAVULANATE 875-125 MG PO TABS: 1.0000 | ORAL_TABLET | Freq: Two times a day (BID) | ORAL | 0 refills | Status: AC

## 2018-03-13 NOTE — Progress Notes (Signed)
Date:  03/13/2018   Name:  Belinda Sanchez   DOB:  [birth year]   MRN:  [id number]   Chief Complaint: Hypertension (Medicare Yearly Checkup. Breast Exam.); Hypothyroidism; and Abdominal Pain (Thinks she is having a flare up of diverticulitis. Started Sunday. Had several BM this morning. Not diarrhea but there was blood in the last one. )  Hypertension  This is a chronic problem. The problem is controlled. Pertinent negatives include no chest pain, headaches, palpitations or shortness of breath. Past treatments include beta blockers, calcium channel blockers and angiotensin blockers. The current treatment provides significant improvement. Identifiable causes of hypertension include a thyroid problem.  Abdominal Pain  This is a recurrent problem. The current episode started yesterday. The onset quality is gradual. The problem has been gradually worsening. The pain is located in the LLQ. The pain is mild. The quality of the pain is cramping and a sensation of fullness. The abdominal pain does not radiate. Associated symptoms include diarrhea. Pertinent negatives include no anorexia, arthralgias, constipation, dysuria, fever, frequency, headaches, hematochezia or vomiting. She has tried nothing for the symptoms.  Thyroid Problem  Presents for follow-up visit. Symptoms include diarrhea. Patient reports no anxiety, constipation, fatigue, palpitations or tremors. The symptoms have been stable.  Insomnia  Primary symptoms: sleep disturbance.  The problem occurs nightly. The problem is unchanged. Past treatments include medication (has been on Ambien or years - no side effects or abnormal behaviors noted).    Review of Systems  Constitutional: Negative for chills, fatigue and fever.  HENT: Negative for congestion, hearing loss, tinnitus, trouble swallowing and voice change.   Eyes: Negative for visual disturbance.  Respiratory: Negative for cough, chest tightness, shortness of breath and wheezing.     Cardiovascular: Negative for chest pain, palpitations and leg swelling.  Gastrointestinal: Positive for abdominal pain and diarrhea. Negative for anorexia, constipation, hematochezia and vomiting.  Endocrine: Negative for polydipsia and polyuria.  Genitourinary: Negative for dysuria, frequency, genital sores, vaginal bleeding and vaginal discharge.  Musculoskeletal: Negative for arthralgias, gait problem and joint swelling.  Skin: Negative for color change and rash.  Neurological: Negative for dizziness, tremors, light-headedness and headaches.  Hematological: Negative for adenopathy. Does not bruise/bleed easily.  Psychiatric/Behavioral: Positive for sleep disturbance. Negative for dysphoric mood. The patient has insomnia. The patient is not nervous/anxious.     Patient Active Problem List   Diagnosis Date Noted  . Arthritis of foot, left 12/20/2016  . Inflammatory spondylopathy of lumbar region ([place]) 08/08/2015  . Herpes simplex infection 12/15/2014  . Acne erythematosa 12/03/2014  . Acquired hypothyroidism 12/03/2014  . CC (Crohn's colitis) ([place]) 12/03/2014  . Dyslipidemia 12/03/2014  . Essential (primary) hypertension 12/03/2014  . Idiopathic insomnia 12/03/2014  . Osteoarthritis of right knee 12/03/2014  . Avitaminosis D 12/03/2014  . History of breast cancer 06/27/2014    Allergies  Allergen Reactions  . Lozol  [Indapamide]     elevated serum calcium  . Loratadine Palpitations    Past Surgical History:  Procedure Laterality Date  . APPENDECTOMY    . BREAST EXCISIONAL BIOPSY Right 04/2014   benign  . COLONOSCOPY  08/2010   one polyp - repeat 5 yrs  . MASTECTOMY, RADICAL Left 2009  . SHOULDER SURGERY  2016   AVN  . VAGINAL HYSTERECTOMY     total    Social History   Tobacco Use  . Smoking status: Never Smoker  . Smokeless tobacco: Never Used  Substance Use Topics  . Alcohol use: Yes  Alcohol/week: 16.0 standard drinks    Types: 2 Standard drinks or  equivalent, 14 Glasses of wine per week  . Drug use: No     Medication list has been reviewed and updated.  Current Meds  Medication Sig  . Acetylcarnitine HCl (ACETYL-L-CARNITINE HCL) POWD by Does not apply route.  Marland Kitchen ammonium lactate (LAC-HYDRIN) 12 % lotion 1 GM APPLY ON THE SKIN DAILY  . BYSTOLIC 10 MG tablet TAKE ONE TABLET BY MOUTH DAILY  . Chromium Picolinate 800 MCG TABS Take by mouth.  . diltiazem (DILACOR XR) 240 MG 24 hr capsule Take 1 capsule (240 mg total) by mouth daily.  . Ergocalciferol (VITAMIN D2) 400 units TABS Take by mouth. Pt taking 5,000 units daily  . Flaxseed, Linseed, (FLAXSEED OIL) 1000 MG CAPS Take by mouth.  Marland Kitchen L-Citrulline POWD by Does not apply route.  Marland Kitchen levothyroxine (SYNTHROID, LEVOTHROID) 175 MCG tablet Take 1 tablet (175 mcg total) by mouth daily before breakfast.  . losartan (COZAAR) 50 MG tablet TAKE ONE TABLET BY MOUTH DAILY  . Multiple Vitamins tablet Take by mouth.  . Omega-3 Fatty Acids (FISH OIL) 1000 MG CPDR Take by mouth.  . vitamin E 100 UNIT capsule Take daily by mouth.  . zolpidem (AMBIEN) 10 MG tablet TAKE ONE TABLET BY MOUTH AT BEDTIME    PHQ 2/9 Scores 03/13/2018 08/09/2017 12/08/2016 03/07/2016  PHQ - 2 Score 0 0 0 0    Physical Exam Vitals signs and nursing note reviewed.  Constitutional:      General: She is not in acute distress.    Appearance: She is well-developed.  HENT:     Head: Normocephalic and atraumatic.     Right Ear: Tympanic membrane and ear canal normal.     Left Ear: Tympanic membrane and ear canal normal.     Nose:     Right Sinus: No maxillary sinus tenderness.     Left Sinus: No maxillary sinus tenderness.     Mouth/Throat:     Pharynx: Uvula midline.  Eyes:     General: No scleral icterus.       Right eye: No discharge.        Left eye: No discharge.     Conjunctiva/sclera: Conjunctivae normal.  Neck:     Musculoskeletal: Normal range of motion. No erythema.     Thyroid: No thyromegaly.     Vascular:  No carotid bruit.  Cardiovascular:     Rate and Rhythm: Normal rate and regular rhythm.     Pulses: Normal pulses.     Heart sounds: Normal heart sounds.  Pulmonary:     Effort: Pulmonary effort is normal. No respiratory distress.     Breath sounds: No wheezing.  Abdominal:     General: Bowel sounds are normal.     Palpations: Abdomen is soft. There is no mass.     Tenderness: There is abdominal tenderness in the left lower quadrant. There is no guarding or rebound.  Musculoskeletal: Normal range of motion.  Lymphadenopathy:     Cervical: No cervical adenopathy.  Skin:    General: Skin is warm and dry.     Findings: No rash.  Neurological:     Mental Status: She is alert and oriented to person, place, and time.     Cranial Nerves: No cranial nerve deficit.     Sensory: No sensory deficit.     Deep Tendon Reflexes: Reflexes are normal and symmetric.  Psychiatric:  Speech: Speech normal.        Behavior: Behavior normal.        Thought Content: Thought content normal.     BP 126/84   Pulse (!) 45   Ht 5' 4"  (1.626 m)   Wt 173 lb (78.5 kg)   SpO2 96%   BMI 29.70 kg/m   Assessment and Plan: 1. Essential (primary) hypertension controlled - CBC with Differential/Platelet - Comprehensive metabolic panel - Lipid panel - POCT urinalysis dipstick  2. Acquired hypothyroidism Supplemented - check labs - TSH  3. Idiopathic insomnia Continue Ambien  4. Diverticulitis of large intestine without perforation or abscess with bleeding - amoxicillin-clavulanate (AUGMENTIN) 875-125 MG tablet; Take 1 tablet by mouth 2 (two) times daily for 10 days.  Dispense: 20 tablet; Refill: 0 - CBC with Differential/Platelet  5. Encounter for screening mammogram for breast cancer Will schedule unilateral Right at [facility] - MM 3D SCREEN BREAST BILATERAL; Future   Partially dictated using Editor, commissioning. Any errors are unintentional.  Halina Maidens, MD [facility]  03/13/2018

## 2018-03-14 LAB — CBC WITH DIFFERENTIAL/PLATELET
BASOS ABS: 0.1 10*3/uL (ref 0.0–0.2)
BASOS: 1 %
EOS (ABSOLUTE): 0.2 10*3/uL (ref 0.0–0.4)
Eos: 2 %
Hematocrit: 42.9 % (ref 34.0–46.6)
Hemoglobin: 14.7 g/dL (ref 11.1–15.9)
IMMATURE GRANS (ABS): 0 10*3/uL (ref 0.0–0.1)
Immature Granulocytes: 0 %
LYMPHS ABS: 2.8 10*3/uL (ref 0.7–3.1)
Lymphs: 30 %
MCH: 33 pg (ref 26.6–33.0)
MCHC: 34.3 g/dL (ref 31.5–35.7)
MCV: 96 fL (ref 79–97)
MONOS ABS: 0.8 10*3/uL (ref 0.1–0.9)
Monocytes: 9 %
Neutrophils Absolute: 5.2 10*3/uL (ref 1.4–7.0)
Neutrophils: 58 %
PLATELETS: 237 10*3/uL (ref 150–450)
RBC: 4.45 x10E6/uL (ref 3.77–5.28)
RDW: 11.8 % (ref 11.7–15.4)
WBC: 9.1 10*3/uL (ref 3.4–10.8)

## 2018-03-14 LAB — COMPREHENSIVE METABOLIC PANEL
A/G RATIO: 1.7 (ref 1.2–2.2)
ALK PHOS: 102 IU/L (ref 39–117)
ALT: 38 IU/L — AB (ref 0–32)
AST: 37 IU/L (ref 0–40)
Albumin: 4.4 g/dL (ref 3.7–4.7)
BILIRUBIN TOTAL: 0.7 mg/dL (ref 0.0–1.2)
BUN/Creatinine Ratio: 18 (ref 12–28)
BUN: 13 mg/dL (ref 8–27)
CHLORIDE: 98 mmol/L (ref 96–106)
CO2: 25 mmol/L (ref 20–29)
Calcium: 10.1 mg/dL (ref 8.7–10.3)
Creatinine, Ser: 0.71 mg/dL (ref 0.57–1.00)
GFR calc non Af Amer: 84 mL/min/{1.73_m2} (ref >59)
GFR, EST AFRICAN AMERICAN: 96 mL/min/{1.73_m2} (ref >59)
GLUCOSE: 108 mg/dL — AB (ref 65–99)
Globulin, Total: 2.6 g/dL (ref 1.5–4.5)
POTASSIUM: 4.4 mmol/L (ref 3.5–5.2)
Sodium: 141 mmol/L (ref 134–144)
TOTAL PROTEIN: 7 g/dL (ref 6.0–8.5)

## 2018-03-14 LAB — LIPID PANEL
CHOLESTEROL TOTAL: 254 mg/dL — AB (ref 100–199)
Chol/HDL Ratio: 3 ratio (ref 0.0–4.4)
HDL: 85 mg/dL (ref >39)
LDL Calculated: 151 mg/dL — ABNORMAL HIGH (ref 0–99)
Triglycerides: 92 mg/dL (ref 0–149)
VLDL Cholesterol Cal: 18 mg/dL (ref 5–40)

## 2018-03-14 LAB — TSH: TSH: 0.551 u[IU]/mL (ref 0.450–4.500)

## 2018-03-19 ENCOUNTER — Other Ambulatory Visit: Payer: Self-pay | Admitting: Internal Medicine

## 2018-03-19 DIAGNOSIS — E039 Hypothyroidism, unspecified: Secondary | ICD-10-CM

## 2018-03-21 ENCOUNTER — Ambulatory Visit: Payer: Medicare Other

## 2018-04-15 ENCOUNTER — Encounter: Payer: Self-pay | Admitting: Internal Medicine

## 2018-04-19 ENCOUNTER — Ambulatory Visit: Payer: Medicare Other | Admitting: Internal Medicine

## 2018-05-15 ENCOUNTER — Telehealth: Payer: Self-pay | Admitting: Internal Medicine

## 2018-05-15 NOTE — Telephone Encounter (Signed)
Spoke with pt to reschedule AWV that had been cancelled, stated she will c/b once COVID is cleared up. knb

## 2018-07-24 LAB — HM MAMMOGRAPHY

## 2018-07-25 ENCOUNTER — Encounter: Payer: Self-pay | Admitting: Internal Medicine

## 2018-07-30 ENCOUNTER — Other Ambulatory Visit: Payer: Self-pay | Admitting: Internal Medicine

## 2018-07-30 DIAGNOSIS — F5101 Primary insomnia: Secondary | ICD-10-CM

## 2018-08-15 ENCOUNTER — Ambulatory Visit: Payer: Medicare Other | Admitting: Internal Medicine

## 2018-08-20 DIAGNOSIS — M7581 Other shoulder lesions, right shoulder: Secondary | ICD-10-CM | POA: Insufficient documentation

## 2018-08-21 ENCOUNTER — Encounter: Payer: Self-pay | Admitting: Internal Medicine

## 2018-08-21 ENCOUNTER — Other Ambulatory Visit: Payer: Self-pay

## 2018-08-21 ENCOUNTER — Ambulatory Visit (INDEPENDENT_AMBULATORY_CARE_PROVIDER_SITE_OTHER): Payer: Medicare Other | Admitting: Internal Medicine

## 2018-08-21 VITALS — BP 122/74 | HR 76 | Temp 97.3°F | Ht 64.0 in | Wt 172.0 lb

## 2018-08-21 DIAGNOSIS — E039 Hypothyroidism, unspecified: Secondary | ICD-10-CM

## 2018-08-21 DIAGNOSIS — R7989 Other specified abnormal findings of blood chemistry: Secondary | ICD-10-CM

## 2018-08-21 DIAGNOSIS — M7581 Other shoulder lesions, right shoulder: Secondary | ICD-10-CM

## 2018-08-21 DIAGNOSIS — I10 Essential (primary) hypertension: Secondary | ICD-10-CM | POA: Diagnosis not present

## 2018-08-21 DIAGNOSIS — R945 Abnormal results of liver function studies: Secondary | ICD-10-CM | POA: Diagnosis not present

## 2018-08-21 DIAGNOSIS — K582 Mixed irritable bowel syndrome: Secondary | ICD-10-CM | POA: Diagnosis not present

## 2018-08-21 MED ORDER — DICYCLOMINE HCL 10 MG PO CAPS: 10.0000 mg | ORAL_CAPSULE | Freq: Three times a day (TID) | ORAL | 1 refills | Status: DC

## 2018-08-21 NOTE — Progress Notes (Signed)
Date:  08/21/2018   Name:  Belinda Sanchez   DOB:  12-26-1940   MRN:  [id number]   Chief Complaint: Hypertension  Hypertension This is a chronic problem. The problem is controlled. Pertinent negatives include no chest pain, headaches, palpitations or shortness of breath. Past treatments include angiotensin blockers. The current treatment provides significant improvement. There are no compliance problems.   Shoulder Pain  This is a new problem. The current episode started more than 1 month ago. There has been a history of trauma. Pertinent negatives include no fever. Treatments tried: cortisone injection. The treatment provided mild (MRI scheduled) relief.  Abdominal Pain This is a chronic problem. The problem occurs every several days. The problem has been unchanged (with occasional episodes of vomiting and diarrhea lasting a few hours and then leaving her exhausted). The pain is located in the LLQ and epigastric region. The pain is mild (most days). The quality of the pain is a sensation of fullness and cramping. Associated symptoms include arthralgias (right shoulder pain), belching and constipation. Pertinent negatives include no anorexia, diarrhea, fever, headaches, melena or weight loss. Nothing aggravates the pain. Prior workup: colonoscopy 2.5 yrs ago - polpys only.   Lab Results  Component Value Date   CREATININE 0.71 03/13/2018   BUN 13 03/13/2018   NA 141 03/13/2018   K 4.4 03/13/2018   CL 98 03/13/2018   CO2 25 03/13/2018   Lab Results  Component Value Date   WBC 9.1 03/13/2018   HGB 14.7 03/13/2018   HCT 42.9 03/13/2018   MCV 96 03/13/2018   PLT 237 03/13/2018     Review of Systems  Constitutional: Negative for chills, fatigue, fever and weight loss.  HENT: Negative for trouble swallowing.   Respiratory: Negative for chest tightness, shortness of breath and wheezing.   Cardiovascular: Negative for chest pain, palpitations and leg swelling.  Gastrointestinal: Positive  for abdominal pain and constipation. Negative for anorexia, blood in stool, diarrhea, melena and rectal pain.  Genitourinary: Negative for difficulty urinating.  Musculoskeletal: Positive for arthralgias (right shoulder pain).  Allergic/Immunologic: Negative for environmental allergies.  Neurological: Negative for dizziness, light-headedness and headaches.  Psychiatric/Behavioral: Negative for dysphoric mood and sleep disturbance. The patient is not nervous/anxious.     Patient Active Problem List   Diagnosis Date Noted  . Arthritis of foot, left 12/20/2016  . Inflammatory spondylopathy of lumbar region ([place]) 08/08/2015  . Herpes simplex infection 12/15/2014  . Acne erythematosa 12/03/2014  . Acquired hypothyroidism 12/03/2014  . Crohn's colitis, without complications ([place]) 12/15/5943  . Dyslipidemia 12/03/2014  . Essential (primary) hypertension 12/03/2014  . Idiopathic insomnia 12/03/2014  . Osteoarthritis of right knee 12/03/2014  . Avitaminosis D 12/03/2014  . History of breast cancer 06/27/2014    Allergies  Allergen Reactions  . Lozol  [Indapamide]     elevated serum calcium  . Loratadine Palpitations    Past Surgical History:  Procedure Laterality Date  . APPENDECTOMY    . BREAST EXCISIONAL BIOPSY Right 04/2014   benign  . COLONOSCOPY  08/2010   one polyp - repeat 5 yrs  . MASTECTOMY, RADICAL Left 2009  . SHOULDER SURGERY  2016   AVN  . VAGINAL HYSTERECTOMY     total    Social History   Tobacco Use  . Smoking status: Never Smoker  . Smokeless tobacco: Never Used  Substance Use Topics  . Alcohol use: Yes    Alcohol/week: 16.0 standard drinks    Types: 2 Standard drinks  or equivalent, 14 Glasses of wine per week  . Drug use: No     Medication list has been reviewed and updated.  Current Meds  Medication Sig  . Acetylcarnitine HCl (ACETYL-L-CARNITINE HCL) POWD by Does not apply route.  Marland Kitchen ammonium lactate (LAC-HYDRIN) 12 % lotion 1 GM APPLY ON THE SKIN  DAILY  . BYSTOLIC 10 MG tablet TAKE ONE TABLET BY MOUTH DAILY  . Chromium Picolinate 800 MCG TABS Take by mouth.  . diltiazem (DILACOR XR) 240 MG 24 hr capsule Take 1 capsule (240 mg total) by mouth daily.  . Ergocalciferol (VITAMIN D2) 400 units TABS Take by mouth. Pt taking 5,000 units daily  . Flaxseed, Linseed, (FLAXSEED OIL) 1000 MG CAPS Take by mouth.  Marland Kitchen L-Citrulline POWD by Does not apply route.  Marland Kitchen levothyroxine (SYNTHROID, LEVOTHROID) 175 MCG tablet TAKE 1 TABLET (175 MCG TOTAL) BY MOUTH DAILY BEFORE BREAKFAST.  Marland Kitchen losartan (COZAAR) 50 MG tablet TAKE ONE TABLET BY MOUTH DAILY  . Multiple Vitamins tablet Take by mouth.  . Omega-3 Fatty Acids (FISH OIL) 1000 MG CPDR Take by mouth.  . vitamin E 100 UNIT capsule Take daily by mouth.  . zolpidem (AMBIEN) 10 MG tablet TAKE ONE TABLET BY MOUTH AT BEDTIME    PHQ 2/9 Scores 08/21/2018 03/13/2018 08/09/2017 12/08/2016  PHQ - 2 Score 0 0 0 0  PHQ- 9 Score 0 - - -    BP Readings from Last 3 Encounters:  08/21/18 122/74  03/13/18 126/84  08/09/17 126/82    Physical Exam Vitals signs and nursing note reviewed.  Constitutional:      General: She is not in acute distress.    Appearance: She is well-developed.  HENT:     Head: Normocephalic and atraumatic.  Neck:     Musculoskeletal: Normal range of motion.     Vascular: No carotid bruit.  Cardiovascular:     Rate and Rhythm: Normal rate and regular rhythm.     Pulses: Normal pulses.  Pulmonary:     Effort: Pulmonary effort is normal. No respiratory distress.  Abdominal:     General: Abdomen is flat. Bowel sounds are normal.     Palpations: Abdomen is soft.     Tenderness: There is no abdominal tenderness. There is no right CVA tenderness, left CVA tenderness, guarding or rebound.  Musculoskeletal:     Right shoulder: She exhibits decreased range of motion and tenderness.     Right lower leg: No edema.     Left lower leg: No edema.  Lymphadenopathy:     Cervical: No cervical  adenopathy.  Skin:    General: Skin is warm and dry.     Capillary Refill: Capillary refill takes less than 2 seconds.     Findings: No rash.  Neurological:     Mental Status: She is alert and oriented to person, place, and time.  Psychiatric:        Attention and Perception: Attention normal.        Mood and Affect: Mood normal.        Behavior: Behavior normal.        Thought Content: Thought content normal.     Wt Readings from Last 3 Encounters:  08/21/18 172 lb (78 kg)  03/13/18 173 lb (78.5 kg)  03/08/17 178 lb (80.7 kg)    BP 122/74   Pulse 76   Temp (!) 97.3 F (36.3 C) (Temporal)   Ht 5' 4"  (1.626 m)   Wt 172 lb (78  kg)   SpO2 96%   BMI 29.52 kg/m   Assessment and Plan: 1. Essential (primary) hypertension Controlled Continue current therapy  2. Irritable bowel syndrome with both constipation and diarrhea Suspect her abd sx with intermittent severe episodes may be IBS Will give a trial of Bentyl Discussed urgent CT scan with next severe episode - dicyclomine (BENTYL) 10 MG capsule; Take 1 capsule (10 mg total) by mouth 3 (three) times daily before meals.  Dispense: 90 capsule; Refill: 1  3. Acquired hypothyroidism supplemented - TSH + free T4  4. Elevated LFTs Continue to monitor Work on low fat diet, weight loss - Hepatic function panel  5. Rotator cuff tendonitis, right MRI and ortho follow up planned   Partially dictated using Editor, commissioning. Any errors are unintentional.  Halina Maidens, MD [facility]  08/21/2018

## 2018-08-22 LAB — HEPATIC FUNCTION PANEL
ALT: 50 IU/L — ABNORMAL HIGH (ref 0–32)
AST: 49 IU/L — ABNORMAL HIGH (ref 0–40)
Albumin: 4.7 g/dL (ref 3.7–4.7)
Alkaline Phosphatase: 87 IU/L (ref 39–117)
Bilirubin Total: 0.8 mg/dL (ref 0.0–1.2)
Bilirubin, Direct: 0.22 mg/dL (ref 0.00–0.40)
Total Protein: 7 g/dL (ref 6.0–8.5)

## 2018-08-22 LAB — TSH+FREE T4
Free T4: 1.2 ng/dL (ref 0.82–1.77)
TSH: 2.29 u[IU]/mL (ref 0.450–4.500)

## 2018-08-24 ENCOUNTER — Telehealth: Payer: Self-pay

## 2018-08-24 ENCOUNTER — Other Ambulatory Visit: Payer: Self-pay | Admitting: Internal Medicine

## 2018-08-24 DIAGNOSIS — K760 Fatty (change of) liver, not elsewhere classified: Secondary | ICD-10-CM

## 2018-08-24 DIAGNOSIS — R7989 Other specified abnormal findings of blood chemistry: Secondary | ICD-10-CM

## 2018-08-24 NOTE — Telephone Encounter (Signed)
ERROR

## 2018-08-29 ENCOUNTER — Ambulatory Visit: Payer: Self-pay

## 2018-08-30 ENCOUNTER — Other Ambulatory Visit: Payer: Self-pay

## 2018-08-30 ENCOUNTER — Ambulatory Visit
Admission: RE | Admit: 2018-08-30 | Discharge: 2018-08-30 | Disposition: A | Discharge status: Home / Self Care | Payer: Medicare Other | Source: Ambulatory Visit | Attending: Internal Medicine | Admitting: Internal Medicine

## 2018-08-30 ENCOUNTER — Other Ambulatory Visit: Payer: Self-pay | Admitting: Internal Medicine

## 2018-08-30 DIAGNOSIS — R945 Abnormal results of liver function studies: Secondary | ICD-10-CM | POA: Insufficient documentation

## 2018-08-30 DIAGNOSIS — K760 Fatty (change of) liver, not elsewhere classified: Secondary | ICD-10-CM | POA: Diagnosis present

## 2018-08-30 DIAGNOSIS — R7989 Other specified abnormal findings of blood chemistry: Secondary | ICD-10-CM

## 2018-10-26 ENCOUNTER — Other Ambulatory Visit: Payer: Self-pay | Admitting: Internal Medicine

## 2018-10-26 DIAGNOSIS — F5101 Primary insomnia: Secondary | ICD-10-CM

## 2018-10-29 ENCOUNTER — Other Ambulatory Visit: Payer: Self-pay | Admitting: Internal Medicine

## 2018-11-08 ENCOUNTER — Other Ambulatory Visit: Payer: Self-pay | Admitting: Internal Medicine

## 2018-11-08 DIAGNOSIS — I10 Essential (primary) hypertension: Secondary | ICD-10-CM

## 2018-12-18 ENCOUNTER — Other Ambulatory Visit: Payer: Self-pay | Admitting: Internal Medicine

## 2018-12-18 DIAGNOSIS — I10 Essential (primary) hypertension: Secondary | ICD-10-CM

## 2019-03-07 ENCOUNTER — Telehealth: Payer: Self-pay | Admitting: Internal Medicine

## 2019-03-07 NOTE — Telephone Encounter (Signed)
Called to schedule Medicare Annual Wellness Visit with Nurse Health Advisor, Clemetine Marker at [facility]. If patient returns call, please schedule AWV with [facility] any date on [facility] schedule (Mon or Wed)  Questions regarding scheduling, please call  [phone] or MS Teams > [email or web address]  [place] . [email or web address]  [phone]

## 2019-03-15 ENCOUNTER — Encounter: Payer: Medicare Other | Admitting: Internal Medicine

## 2019-03-19 ENCOUNTER — Ambulatory Visit (INDEPENDENT_AMBULATORY_CARE_PROVIDER_SITE_OTHER): Payer: Medicare Other | Admitting: Internal Medicine

## 2019-03-19 ENCOUNTER — Other Ambulatory Visit: Payer: Self-pay

## 2019-03-19 ENCOUNTER — Encounter: Payer: Self-pay | Admitting: Internal Medicine

## 2019-03-19 VITALS — BP 118/64 | HR 68 | Temp 98.2°F | Ht 64.0 in | Wt 141.0 lb

## 2019-03-19 DIAGNOSIS — E039 Hypothyroidism, unspecified: Secondary | ICD-10-CM

## 2019-03-19 DIAGNOSIS — K5732 Diverticulitis of large intestine without perforation or abscess without bleeding: Secondary | ICD-10-CM

## 2019-03-19 DIAGNOSIS — I10 Essential (primary) hypertension: Secondary | ICD-10-CM

## 2019-03-19 DIAGNOSIS — E559 Vitamin D deficiency, unspecified: Secondary | ICD-10-CM

## 2019-03-19 DIAGNOSIS — Z0000 Encounter for general adult medical examination without abnormal findings: Secondary | ICD-10-CM | POA: Diagnosis not present

## 2019-03-19 DIAGNOSIS — Z853 Personal history of malignant neoplasm of breast: Secondary | ICD-10-CM

## 2019-03-19 DIAGNOSIS — E785 Hyperlipidemia, unspecified: Secondary | ICD-10-CM

## 2019-03-19 LAB — POCT URINALYSIS DIPSTICK
Blood, UA: NEGATIVE
Glucose, UA: NEGATIVE
Leukocytes, UA: NEGATIVE
Nitrite, UA: NEGATIVE
Protein, UA: POSITIVE — AB
Spec Grav, UA: 1.02 (ref 1.010–1.025)
Urobilinogen, UA: 0.2 E.U./dL
pH, UA: 6 (ref 5.0–8.0)

## 2019-03-19 MED ORDER — LEVOTHYROXINE SODIUM 175 MCG PO TABS: 175.0000 ug | ORAL_TABLET | Freq: Every day | ORAL | 3 refills | Status: DC

## 2019-03-19 NOTE — Progress Notes (Signed)
Date:  03/19/2019   Name:  Belinda Sanchez   DOB:  08/18/1940   MRN:  [id number]   Chief Complaint: Annual Exam (breast exam, no pap ) Belinda Sanchez is a 79 y.o. female who presents today for her Complete Annual Exam. She feels fairly well. She reports exercising rarely. She reports she is sleeping fairly well.   Mammogram scheduled DEXA 2014 Immunization History  Administered Date(s) Administered  . Influenza,inj,Quad PF,6+ Mos 12/15/2014, 10/26/2015, 10/16/2018  . Influenza-Unspecified 11/22/2016  . Pneumococcal Conjugate-13 12/23/2013  . Pneumococcal Polysaccharide-23 02/16/2007, 12/10/2012  . Tdap 05/27/2013  . Zoster 02/15/2010  . Zoster Recombinat (Shingrix) 07/23/2016, 01/08/2017    Hypertension This is a chronic problem. The problem is controlled. Associated symptoms include palpitations (intermittent). Pertinent negatives include no chest pain, headaches or shortness of breath. Past treatments include calcium channel blockers, angiotensin blockers and beta blockers. The current treatment provides significant improvement. There are no compliance problems.  Identifiable causes of hypertension include a thyroid problem.  Thyroid Problem Presents for follow-up visit. Symptoms include fatigue, palpitations (intermittent) and weight loss. Patient reports no anxiety, constipation, depressed mood, diarrhea or leg swelling. The symptoms have been stable.  Abdominal Pain This is a recurrent problem. Progression since onset: acute episodes in Nov and Jan. The pain is located in the LLQ. The pain is mild. The quality of the pain is cramping and colicky. The abdominal pain does not radiate. Associated symptoms include weight loss. Pertinent negatives include no arthralgias, constipation, diarrhea, dysuria, fever or headaches. Treatments tried: 2 courses of Cipro and Flagyl for diverticulosis. The treatment provided moderate relief. Prior diagnostic workup includes CT scan.  Two visits to the  ER with CT showing stranding and mild diverticulitis without other worrisome features.  Responded partially to Cipro and Flagyl.  Still sore in the LLQ.  Tried to see GI but could not get an appt for weeks.  Lab Results  Component Value Date   CREATININE 0.71 03/13/2018   BUN 13 03/13/2018   NA 141 03/13/2018   K 4.4 03/13/2018   CL 98 03/13/2018   CO2 25 03/13/2018   Lab Results  Component Value Date   CHOL 254 (H) 03/13/2018   HDL 85 03/13/2018   LDLCALC 151 (H) 03/13/2018   TRIG 92 03/13/2018   CHOLHDL 3.0 03/13/2018   Lab Results  Component Value Date   TSH 2.290 08/21/2018   Lab Results  Component Value Date   ALT 50 (H) 08/21/2018   AST 49 (H) 08/21/2018   ALKPHOS 87 08/21/2018   BILITOT 0.8 08/21/2018     Review of Systems  Constitutional: Positive for fatigue, unexpected weight change (30 lbs in 3 months) and weight loss. Negative for chills and fever.  HENT: Negative for sore throat, trouble swallowing and voice change.   Eyes: Negative for visual disturbance.  Respiratory: Negative for cough, chest tightness, shortness of breath and wheezing.   Cardiovascular: Positive for palpitations (intermittent). Negative for chest pain.  Gastrointestinal: Positive for abdominal pain. Negative for constipation and diarrhea.  Genitourinary: Negative for dysuria.  Musculoskeletal: Negative for arthralgias and gait problem.  Skin: Negative for color change and rash.  Allergic/Immunologic: Negative for environmental allergies.  Neurological: Negative for dizziness, light-headedness and headaches.  Hematological: Negative for adenopathy.  Psychiatric/Behavioral: Negative for dysphoric mood and sleep disturbance. The patient is not nervous/anxious.     Patient Active Problem List   Diagnosis Date Noted  . Hepatic steatosis 08/24/2018  . Elevated LFTs 08/21/2018  .  Rotator cuff tendonitis, right 08/20/2018  . Arthritis of foot, left 12/20/2016  . Inflammatory  spondylopathy of lumbar region ([place]) 08/08/2015  . Herpes simplex infection 12/15/2014  . Acne erythematosa 12/03/2014  . Acquired hypothyroidism 12/03/2014  . Crohn's colitis, without complications ([place]) 00/34/9179  . Dyslipidemia 12/03/2014  . Essential (primary) hypertension 12/03/2014  . Idiopathic insomnia 12/03/2014  . Osteoarthritis of right knee 12/03/2014  . Avitaminosis D 12/03/2014  . History of breast cancer 06/27/2014    Allergies  Allergen Reactions  . Lozol  [Indapamide]     elevated serum calcium  . Loratadine Palpitations    Past Surgical History:  Procedure Laterality Date  . APPENDECTOMY    . BREAST EXCISIONAL BIOPSY Right 04/2014   benign  . COLONOSCOPY  08/2010   one polyp - repeat 5 yrs  . MASTECTOMY, RADICAL Left 2009  . SHOULDER SURGERY  2016   AVN  . VAGINAL HYSTERECTOMY     total    Social History   Tobacco Use  . Smoking status: Never Smoker  . Smokeless tobacco: Never Used  Substance Use Topics  . Alcohol use: Yes    Alcohol/week: 16.0 standard drinks    Types: 2 Standard drinks or equivalent, 14 Glasses of wine per week  . Drug use: No     Medication list has been reviewed and updated.  Current Meds  Medication Sig  . Acetylcarnitine HCl (ACETYL-L-CARNITINE HCL) POWD by Does not apply route.  Marland Kitchen ammonium lactate (LAC-HYDRIN) 12 % lotion 1 GM APPLY ON THE SKIN DAILY  . BYSTOLIC 10 MG tablet TAKE ONE TABLET BY MOUTH DAILY  . Chromium Picolinate 800 MCG TABS Take by mouth.  . dicyclomine (BENTYL) 10 MG capsule Take 1 capsule (10 mg total) by mouth 3 (three) times daily before meals.  Marland Kitchen diltiazem (DILACOR XR) 240 MG 24 hr capsule Take 1 capsule (240 mg total) by mouth daily.  . Ergocalciferol (VITAMIN D2) 400 units TABS Take by mouth. Pt taking 5,000 units daily  . Flaxseed, Linseed, (FLAXSEED OIL) 1000 MG CAPS Take by mouth.  Marland Kitchen L-Citrulline POWD by Does not apply route.  Marland Kitchen levothyroxine (SYNTHROID, LEVOTHROID) 175 MCG tablet TAKE 1  TABLET (175 MCG TOTAL) BY MOUTH DAILY BEFORE BREAKFAST.  Marland Kitchen losartan (COZAAR) 50 MG tablet TAKE ONE TABLET BY MOUTH DAILY  . Multiple Vitamins tablet Take by mouth.  . Omega-3 Fatty Acids (FISH OIL) 1000 MG CPDR Take by mouth.  . vitamin E 100 UNIT capsule Take daily by mouth.  . zolpidem (AMBIEN) 10 MG tablet TAKE ONE TABLET BY MOUTH AT BEDTIME    PHQ 2/9 Scores 03/19/2019 08/21/2018 03/13/2018 08/09/2017  PHQ - 2 Score 0 0 0 0  PHQ- 9 Score 3 0 - -    BP Readings from Last 3 Encounters:  03/19/19 118/64  08/21/18 122/74  03/13/18 126/84    Physical Exam Vitals and nursing note reviewed.  Constitutional:      General: She is not in acute distress.    Appearance: She is well-developed.  HENT:     Head: Normocephalic and atraumatic.     Right Ear: Tympanic membrane and ear canal normal.     Left Ear: Tympanic membrane and ear canal normal.     Nose:     Right Sinus: No maxillary sinus tenderness.     Left Sinus: No maxillary sinus tenderness.  Eyes:     General: No scleral icterus.       Right eye: No discharge.  Left eye: No discharge.     Conjunctiva/sclera: Conjunctivae normal.  Neck:     Thyroid: No thyromegaly.     Vascular: No carotid bruit.  Cardiovascular:     Rate and Rhythm: Normal rate and regular rhythm.     Pulses: Normal pulses.     Heart sounds: Normal heart sounds.  Pulmonary:     Effort: Pulmonary effort is normal. No respiratory distress.     Breath sounds: No wheezing.  Abdominal:     General: Abdomen is flat. Bowel sounds are normal.     Palpations: Abdomen is soft.     Tenderness: There is abdominal tenderness in the left lower quadrant. There is no right CVA tenderness, left CVA tenderness, guarding or rebound.  Musculoskeletal:        General: Normal range of motion.     Cervical back: Normal range of motion. No erythema.  Lymphadenopathy:     Cervical: No cervical adenopathy.  Skin:    General: Skin is warm and dry.     Findings: No rash.   Neurological:     Mental Status: She is alert and oriented to person, place, and time.     Cranial Nerves: No cranial nerve deficit.     Sensory: No sensory deficit.     Deep Tendon Reflexes: Reflexes are normal and symmetric.  Psychiatric:        Attention and Perception: Attention normal.        Mood and Affect: Mood normal.        Speech: Speech normal.        Behavior: Behavior normal.        Thought Content: Thought content normal.     Wt Readings from Last 3 Encounters:  03/19/19 141 lb (64 kg)  08/21/18 172 lb (78 kg)  03/13/18 173 lb (78.5 kg)    BP 118/64   Pulse 68   Temp 98.2 F (36.8 C) (Oral)   Ht 5' 4"  (1.626 m)   Wt 141 lb (64 kg)   SpO2 94%   BMI 24.20 kg/m   Assessment and Plan: 1. Annual physical exam Normal exam except for weight loss Recommend adding Ensure or Boost twice a day for nutritional support  2. History of breast cancer Mammogram is scheduled at [facility]  3. Essential (primary) hypertension Clinically stable exam with well controlled BP on triple therapy - losartan, bystolic and cardiazem. Tolerating medications without side effects at this time. Pt to continue current regimen and low sodium diet; benefits of regular exercise as able discussed. - CBC with Differential/Platelet - Comprehensive metabolic panel - POCT urinalysis dipstick  4. 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  Significant weight loss due to decreased appetite and very limited diet in attempt to control symptoms Would consider getting another CT since completing antibiotics but would prefer her to see GI first. Will send an urgent referral to Dr. Comer Locket - her current gastroenterologist.  5. Acquired hypothyroidism supplemented - levothyroxine (SYNTHROID) 175 MCG tablet; Take 1 tablet (175 mcg total) by mouth daily before breakfast.  Dispense: 90 tablet; Refill: 3 - TSH + free T4  6.  Avitaminosis D Continue daily supplement - VITAMIN D 25 Hydroxy (Vit-D Deficiency, Fractures)  7. Dyslipidemia Check labs - Lipid panel   Partially dictated using Dragon software. Any errors are unintentional.  Halina Maidens, MD [facility]  03/19/2019

## 2019-03-20 LAB — LIPID PANEL
Chol/HDL Ratio: 4.2 ratio (ref 0.0–4.4)
Cholesterol, Total: 231 mg/dL — ABNORMAL HIGH (ref 100–199)
HDL: 55 mg/dL (ref >39)
LDL Chol Calc (NIH): 153 mg/dL — ABNORMAL HIGH (ref 0–99)
Triglycerides: 130 mg/dL (ref 0–149)
VLDL Cholesterol Cal: 23 mg/dL (ref 5–40)

## 2019-03-20 LAB — COMPREHENSIVE METABOLIC PANEL
ALT: 22 IU/L (ref 0–32)
AST: 34 IU/L (ref 0–40)
Albumin/Globulin Ratio: 1.8 (ref 1.2–2.2)
Albumin: 4.2 g/dL (ref 3.7–4.7)
Alkaline Phosphatase: 71 IU/L (ref 39–117)
BUN/Creatinine Ratio: 16 (ref 12–28)
BUN: 11 mg/dL (ref 8–27)
Bilirubin Total: 0.6 mg/dL (ref 0.0–1.2)
CO2: 23 mmol/L (ref 20–29)
Calcium: 10.4 mg/dL — ABNORMAL HIGH (ref 8.7–10.3)
Chloride: 103 mmol/L (ref 96–106)
Creatinine, Ser: 0.68 mg/dL (ref 0.57–1.00)
GFR calc Af Amer: 98 mL/min/{1.73_m2} (ref >59)
GFR calc non Af Amer: 85 mL/min/{1.73_m2} (ref >59)
Globulin, Total: 2.4 g/dL (ref 1.5–4.5)
Glucose: 92 mg/dL (ref 65–99)
Potassium: 4.4 mmol/L (ref 3.5–5.2)
Sodium: 144 mmol/L (ref 134–144)
Total Protein: 6.6 g/dL (ref 6.0–8.5)

## 2019-03-20 LAB — TSH+FREE T4
Free T4: 3 ng/dL — ABNORMAL HIGH (ref 0.82–1.77)
TSH: 0.017 u[IU]/mL — ABNORMAL LOW (ref 0.450–4.500)

## 2019-03-20 LAB — CBC WITH DIFFERENTIAL/PLATELET
Basophils Absolute: 0.1 10*3/uL (ref 0.0–0.2)
Basos: 2 %
EOS (ABSOLUTE): 0.3 10*3/uL (ref 0.0–0.4)
Eos: 3 %
Hematocrit: 43.1 % (ref 34.0–46.6)
Hemoglobin: 14.5 g/dL (ref 11.1–15.9)
Immature Grans (Abs): 0 10*3/uL (ref 0.0–0.1)
Immature Granulocytes: 0 %
Lymphocytes Absolute: 2.3 10*3/uL (ref 0.7–3.1)
Lymphs: 32 %
MCH: 30.2 pg (ref 26.6–33.0)
MCHC: 33.6 g/dL (ref 31.5–35.7)
MCV: 90 fL (ref 79–97)
Monocytes Absolute: 0.8 10*3/uL (ref 0.1–0.9)
Monocytes: 11 %
Neutrophils Absolute: 3.9 10*3/uL (ref 1.4–7.0)
Neutrophils: 52 %
Platelets: 214 10*3/uL (ref 150–450)
RBC: 4.8 x10E6/uL (ref 3.77–5.28)
RDW: 12 % (ref 11.7–15.4)
WBC: 7.3 10*3/uL (ref 3.4–10.8)

## 2019-03-20 LAB — VITAMIN D 25 HYDROXY (VIT D DEFICIENCY, FRACTURES): Vit D, 25-Hydroxy: 63.4 ng/mL (ref 30.0–100.0)

## 2019-03-21 ENCOUNTER — Other Ambulatory Visit: Payer: Self-pay

## 2019-03-21 MED ORDER — LEVOTHYROXINE SODIUM 150 MCG PO TABS: 150.0000 ug | ORAL_TABLET | Freq: Every day | ORAL | 1 refills | Status: DC

## 2019-04-02 HISTORY — PX: EYE SURGERY: SHX253

## 2019-04-20 ENCOUNTER — Other Ambulatory Visit: Payer: Self-pay | Admitting: Internal Medicine

## 2019-04-21 ENCOUNTER — Other Ambulatory Visit: Payer: Self-pay | Admitting: Internal Medicine

## 2019-04-21 DIAGNOSIS — F5101 Primary insomnia: Secondary | ICD-10-CM

## 2019-05-03 ENCOUNTER — Other Ambulatory Visit: Payer: Self-pay | Admitting: Internal Medicine

## 2019-05-03 DIAGNOSIS — I10 Essential (primary) hypertension: Secondary | ICD-10-CM

## 2019-05-09 ENCOUNTER — Ambulatory Visit: Payer: Medicare Other

## 2019-05-21 ENCOUNTER — Ambulatory Visit (INDEPENDENT_AMBULATORY_CARE_PROVIDER_SITE_OTHER): Payer: Medicare Other | Admitting: Internal Medicine

## 2019-05-21 ENCOUNTER — Other Ambulatory Visit: Payer: Self-pay

## 2019-05-21 ENCOUNTER — Encounter: Payer: Self-pay | Admitting: Internal Medicine

## 2019-05-21 VITALS — BP 128/74 | HR 50 | Temp 96.2°F | Ht 64.0 in | Wt 131.0 lb

## 2019-05-21 DIAGNOSIS — I10 Essential (primary) hypertension: Secondary | ICD-10-CM | POA: Diagnosis not present

## 2019-05-21 DIAGNOSIS — E039 Hypothyroidism, unspecified: Secondary | ICD-10-CM

## 2019-05-21 NOTE — Progress Notes (Signed)
Date:  05/21/2019   Name:  Belinda Sanchez   DOB:  04/30/1940   MRN:  [id number]   Chief Complaint: Hyperthyroidism (Follow up- losing weight to fast. Heart palpitations. Sure this is the cause.  Needs labs and to adjust thyroid meds. )  Thyroid Problem Presents for follow-up visit. Symptoms include palpitations and weight loss. Patient reports no constipation, depressed mood, diarrhea, fatigue or hair loss. (Dose of levothyroxine was changed after labs in [place])    Lab Results  Component Value Date   CREATININE 0.68 03/19/2019   BUN 11 03/19/2019   NA 144 03/19/2019   K 4.4 03/19/2019   CL 103 03/19/2019   CO2 23 03/19/2019   Lab Results  Component Value Date   CHOL 231 (H) 03/19/2019   HDL 55 03/19/2019   LDLCALC 153 (H) 03/19/2019   TRIG 130 03/19/2019   CHOLHDL 4.2 03/19/2019   Lab Results  Component Value Date   TSH 0.017 (L) 03/19/2019   No results found for: HGBA1C Lab Results  Component Value Date   WBC 7.3 03/19/2019   HGB 14.5 03/19/2019   HCT 43.1 03/19/2019   MCV 90 03/19/2019   PLT 214 03/19/2019   Lab Results  Component Value Date   ALT 22 03/19/2019   AST 34 03/19/2019   ALKPHOS 71 03/19/2019   BILITOT 0.6 03/19/2019     Review of Systems  Constitutional: Positive for unexpected weight change and weight loss. Negative for fatigue.  Respiratory: Negative for cough, chest tightness and shortness of breath.   Cardiovascular: Positive for palpitations. Negative for chest pain and leg swelling.  Gastrointestinal: Negative for constipation and diarrhea.  Neurological: Negative for dizziness and headaches.    Patient Active Problem List   Diagnosis Date Noted  . Hepatic steatosis 08/24/2018  . Elevated LFTs 08/21/2018  . Rotator cuff tendonitis, right 08/20/2018  . Arthritis of foot, left 12/20/2016  . Inflammatory spondylopathy of lumbar region ([place]) 08/08/2015  . Herpes simplex infection 12/15/2014  . Acne erythematosa 12/03/2014  .  Acquired hypothyroidism 12/03/2014  . Crohn's colitis, without complications ([place]) 50/35/4656  . Dyslipidemia 12/03/2014  . Essential (primary) hypertension 12/03/2014  . Idiopathic insomnia 12/03/2014  . Osteoarthritis of right knee 12/03/2014  . Avitaminosis D 12/03/2014  . History of breast cancer 06/27/2014    Allergies  Allergen Reactions  . Lozol  [Indapamide]     elevated serum calcium  . Loratadine Palpitations    Past Surgical History:  Procedure Laterality Date  . APPENDECTOMY    . BREAST EXCISIONAL BIOPSY Right 04/2014   benign  . COLONOSCOPY  08/2010   one polyp - repeat 5 yrs  . MASTECTOMY, RADICAL Left 2009  . SHOULDER SURGERY  2016   AVN  . VAGINAL HYSTERECTOMY     total    Social History   Tobacco Use  . Smoking status: Never Smoker  . Smokeless tobacco: Never Used  Substance Use Topics  . Alcohol use: Yes    Alcohol/week: 16.0 standard drinks    Types: 2 Standard drinks or equivalent, 14 Glasses of wine per week  . Drug use: No     Medication list has been reviewed and updated.  Current Meds  Medication Sig  . Acetylcarnitine HCl (ACETYL-L-CARNITINE HCL) POWD by Does not apply route.  Marland Kitchen ammonium lactate (LAC-HYDRIN) 12 % lotion 1 GM APPLY ON THE SKIN DAILY  . BYSTOLIC 10 MG tablet TAKE ONE TABLET BY MOUTH DAILY  . Chromium Picolinate 800  MCG TABS Take by mouth.  . diltiazem (TIAZAC) 240 MG 24 hr capsule TAKE 1 CAPSULE (240 MG TOTAL) BY MOUTH DAILY.  . Ergocalciferol (VITAMIN D2) 400 units TABS Take by mouth. Pt taking 5,000 units daily  . Flaxseed, Linseed, (FLAXSEED OIL) 1000 MG CAPS Take by mouth.  Marland Kitchen L-Citrulline POWD by Does not apply route.  Marland Kitchen levothyroxine (SYNTHROID) 150 MCG tablet Take 1 tablet (150 mcg total) by mouth daily.  Marland Kitchen losartan (COZAAR) 50 MG tablet TAKE ONE TABLET BY MOUTH DAILY  . Multiple Vitamins tablet Take by mouth.  . Omega-3 Fatty Acids (FISH OIL) 1000 MG CPDR Take by mouth.  . vitamin E 100 UNIT capsule Take daily by  mouth.  . zolpidem (AMBIEN) 10 MG tablet TAKE ONE TABLET BY MOUTH AT BEDTIME    PHQ 2/9 Scores 05/21/2019 03/19/2019 08/21/2018 03/13/2018  PHQ - 2 Score 0 0 0 0  PHQ- 9 Score 2 3 0 -    BP Readings from Last 3 Encounters:  05/21/19 128/74  03/19/19 118/64  08/21/18 122/74    Physical Exam Vitals and nursing note reviewed.  Constitutional:      General: She is not in acute distress.    Appearance: Normal appearance. She is well-developed.  HENT:     Head: Normocephalic and atraumatic.  Cardiovascular:     Rate and Rhythm: Normal rate and regular rhythm.     Pulses: Normal pulses.  Pulmonary:     Effort: Pulmonary effort is normal. No respiratory distress.     Breath sounds: No wheezing or rhonchi.  Musculoskeletal:     Cervical back: Normal range of motion.     Right lower leg: No edema.     Left lower leg: No edema.  Skin:    General: Skin is warm and dry.     Findings: No rash.  Neurological:     Mental Status: She is alert and oriented to person, place, and time.  Psychiatric:        Behavior: Behavior normal.        Thought Content: Thought content normal.     Wt Readings from Last 3 Encounters:  05/21/19 131 lb (59.4 kg)  03/19/19 141 lb (64 kg)  08/21/18 172 lb (78 kg)    BP 128/74   Pulse (!) 50   Temp (!) 96.2 F (35.7 C) (Temporal)   Ht 5' 4"  (1.626 m)   Wt 131 lb (59.4 kg)   SpO2 97%   BMI 22.49 kg/m   Assessment and Plan: 1. Acquired hypothyroidism Dose decreased recently - will repeat labs to determine if she is over-supplemented or has now converted to hyperthyroidism. - TSH+T4F+T3Free  2. Essential (primary) hypertension Clinically stable exam with well controlled BP. Tolerating medications without side effects at this time. Pt to continue current regimen and low sodium diet; benefits of regular exercise as able discussed.   Partially dictated using Editor, commissioning. Any errors are unintentional.  Halina Maidens, MD [facility]  05/21/2019

## 2019-05-22 LAB — TSH+T4F+T3FREE
Free T4: 2.31 ng/dL — ABNORMAL HIGH (ref 0.82–1.77)
T3, Free: 2.3 pg/mL (ref 2.0–4.4)
TSH: 0.023 u[IU]/mL — ABNORMAL LOW (ref 0.450–4.500)

## 2019-06-07 ENCOUNTER — Other Ambulatory Visit: Payer: Self-pay

## 2019-06-07 ENCOUNTER — Telehealth: Payer: Self-pay | Admitting: Internal Medicine

## 2019-06-07 DIAGNOSIS — E039 Hypothyroidism, unspecified: Secondary | ICD-10-CM

## 2019-06-07 NOTE — Telephone Encounter (Signed)
Placed referral patient.  CM

## 2019-06-07 NOTE — Telephone Encounter (Unsigned)
Copied from [place] [phone]. Topic: Referral - Request for Referral >> Jun 07, 2019  9:42 AM Erick Blinks wrote: Has patient seen PCP for this complaint? Yes.   *If NO, is insurance requiring patient see PCP for this issue before PCP can refer them? Referral for which specialty: Endocrinologist  Preferred provider/office: in [facility]  Reason for referral: Referral becaue Thyroid condition is not improving

## 2019-07-02 ENCOUNTER — Encounter: Payer: Self-pay | Admitting: Internal Medicine

## 2019-07-02 ENCOUNTER — Other Ambulatory Visit: Payer: Self-pay

## 2019-07-02 ENCOUNTER — Ambulatory Visit (INDEPENDENT_AMBULATORY_CARE_PROVIDER_SITE_OTHER): Payer: Medicare Other | Admitting: Internal Medicine

## 2019-07-02 VITALS — BP 126/80 | HR 68 | Ht 64.0 in | Wt 133.0 lb

## 2019-07-02 DIAGNOSIS — E039 Hypothyroidism, unspecified: Secondary | ICD-10-CM

## 2019-07-02 NOTE — Progress Notes (Signed)
Date:  07/02/2019   Name:  Belinda Sanchez   DOB:  05-15-1940   MRN:  [id number]   Chief Complaint: Fatigue (Discuss thyroid issues. Cold all the time, and fatigue. Insomnia.) and Weight Loss  Thyroid Problem Presents for follow-up visit. Patient reports no constipation, diaphoresis, diarrhea, fatigue or palpitations.   03/2019: Pt complained of weight loss - 30 lbs in 4 months along with palpitations.   Dose reduced to 150 mcg.  05/2019: Thyroid tests improved but weight down another 10 lbs and palpitations continued ; meds stopped completely Pt referred to endocrinology.  Appt is 07/09/19 at [facility], virtual visit. Current status: weight has stabilized. Pt wants to get labs rechecked, since she is feeling better, may not keep the Endo appt.   Lab Results  Component Value Date   TSH 0.023 (L) 05/21/2019    Lab Results  Component Value Date   CREATININE 0.68 03/19/2019   BUN 11 03/19/2019   NA 144 03/19/2019   K 4.4 03/19/2019   CL 103 03/19/2019   CO2 23 03/19/2019   Lab Results  Component Value Date   CHOL 231 (H) 03/19/2019   HDL 55 03/19/2019   LDLCALC 153 (H) 03/19/2019   TRIG 130 03/19/2019   CHOLHDL 4.2 03/19/2019    No results found for: HGBA1C Lab Results  Component Value Date   WBC 7.3 03/19/2019   HGB 14.5 03/19/2019   HCT 43.1 03/19/2019   MCV 90 03/19/2019   PLT 214 03/19/2019   Lab Results  Component Value Date   ALT 22 03/19/2019   AST 34 03/19/2019   ALKPHOS 71 03/19/2019   BILITOT 0.6 03/19/2019     Review of Systems  Constitutional: Positive for chills. Negative for diaphoresis, fatigue, fever and unexpected weight change (weight is stable around 133).  HENT: Negative for sore throat and trouble swallowing.   Respiratory: Negative for chest tightness and shortness of breath.   Cardiovascular: Negative for chest pain, palpitations and leg swelling.  Gastrointestinal: Negative for constipation and diarrhea.  Neurological: Negative for dizziness  and headaches.    Patient Active Problem List   Diagnosis Date Noted  . Hepatic steatosis 08/24/2018  . Elevated LFTs 08/21/2018  . Rotator cuff tendonitis, right 08/20/2018  . Arthritis of foot, left 12/20/2016  . Inflammatory spondylopathy of lumbar region ([place]) 08/08/2015  . Herpes simplex infection 12/15/2014  . Acne erythematosa 12/03/2014  . Acquired hypothyroidism 12/03/2014  . Crohn's colitis, without complications ([place]) 67/59/1638  . Dyslipidemia 12/03/2014  . Essential (primary) hypertension 12/03/2014  . Idiopathic insomnia 12/03/2014  . Osteoarthritis of right knee 12/03/2014  . Avitaminosis D 12/03/2014  . History of breast cancer 06/27/2014    Allergies  Allergen Reactions  . Lozol  [Indapamide]     elevated serum calcium  . Loratadine Palpitations    Past Surgical History:  Procedure Laterality Date  . APPENDECTOMY    . BREAST EXCISIONAL BIOPSY Right 04/2014   benign  . COLONOSCOPY  08/2010   one polyp - repeat 5 yrs  . MASTECTOMY, RADICAL Left 2009  . SHOULDER SURGERY  2016   AVN  . VAGINAL HYSTERECTOMY     total    Social History   Tobacco Use  . Smoking status: Never Smoker  . Smokeless tobacco: Never Used  Substance Use Topics  . Alcohol use: Yes    Alcohol/week: 16.0 standard drinks    Types: 2 Standard drinks or equivalent, 14 Glasses of wine per week  .  Drug use: No     Medication list has been reviewed and updated.  Current Meds  Medication Sig  . Acetylcarnitine HCl (ACETYL-L-CARNITINE HCL) POWD by Does not apply route.  Marland Kitchen ammonium lactate (LAC-HYDRIN) 12 % lotion 1 GM APPLY ON THE SKIN DAILY  . BYSTOLIC 10 MG tablet TAKE ONE TABLET BY MOUTH DAILY  . Chromium Picolinate 800 MCG TABS Take by mouth.  . diltiazem (TIAZAC) 240 MG 24 hr capsule TAKE 1 CAPSULE (240 MG TOTAL) BY MOUTH DAILY.  . Ergocalciferol (VITAMIN D2) 400 units TABS Take by mouth. Pt taking 5,000 units daily  . Flaxseed, Linseed, (FLAXSEED OIL) 1000 MG CAPS Take by  mouth.  Marland Kitchen L-Citrulline POWD by Does not apply route.  Marland Kitchen losartan (COZAAR) 50 MG tablet TAKE ONE TABLET BY MOUTH DAILY  . Multiple Vitamins tablet Take by mouth.  . Omega-3 Fatty Acids (FISH OIL) 1000 MG CPDR Take by mouth.  . vitamin E 100 UNIT capsule Take daily by mouth.  . zolpidem (AMBIEN) 10 MG tablet TAKE ONE TABLET BY MOUTH AT BEDTIME    PHQ 2/9 Scores 07/02/2019 05/21/2019 03/19/2019 08/21/2018  PHQ - 2 Score 3 0 0 0  PHQ- 9 Score 9 2 3  0    BP Readings from Last 3 Encounters:  07/02/19 126/80  05/21/19 128/74  03/19/19 118/64    Physical Exam Vitals and nursing note reviewed.  Constitutional:      General: She is not in acute distress.    Appearance: Normal appearance. She is well-developed.  HENT:     Head: Normocephalic and atraumatic.  Neck:     Vascular: No carotid bruit.  Cardiovascular:     Rate and Rhythm: Normal rate and regular rhythm.  Pulmonary:     Effort: Pulmonary effort is normal. No respiratory distress.     Breath sounds: No wheezing or rhonchi.  Musculoskeletal:     Cervical back: Normal range of motion. No tenderness.     Right lower leg: No edema.     Left lower leg: No edema.  Lymphadenopathy:     Cervical: No cervical adenopathy.  Skin:    General: Skin is warm and dry.     Findings: No rash.  Neurological:     Mental Status: She is alert and oriented to person, place, and time.  Psychiatric:        Mood and Affect: Mood normal.     Wt Readings from Last 3 Encounters:  07/02/19 133 lb (60.3 kg)  05/21/19 131 lb (59.4 kg)  03/19/19 141 lb (64 kg)    BP 126/80   Pulse 68   Ht 5' 4"  (1.626 m)   Wt 133 lb (60.3 kg)   SpO2 96%   BMI 22.83 kg/m   Assessment and Plan: 1. Acquired hypothyroidism Previously on thyroid supplements for many years.  3 mo ago TSH was low and T4 elevated.  She had no sx to suggest thyroiditis.  Dose was decreased then 6 weeks labs, stopped completely.  TSH and T4 had improved but were still not  normal. Today she feels well - no further palpitations, shakiness or weight loss. Will repeat TFTs today then decide if she needs to consult endocrinology. - Thyroid Panel With TSH   Partially dictated using Dragon software. Any errors are unintentional.  Halina Maidens, MD [facility]  07/02/2019

## 2019-07-03 ENCOUNTER — Other Ambulatory Visit: Payer: Self-pay | Admitting: Internal Medicine

## 2019-07-03 ENCOUNTER — Encounter: Payer: Self-pay | Admitting: Internal Medicine

## 2019-07-03 ENCOUNTER — Telehealth: Payer: Self-pay | Admitting: Internal Medicine

## 2019-07-03 DIAGNOSIS — E039 Hypothyroidism, unspecified: Secondary | ICD-10-CM

## 2019-07-03 LAB — THYROID PANEL WITH TSH
Free Thyroxine Index: 0.1 — ABNORMAL LOW (ref 1.2–4.9)
T3 Uptake Ratio: 15 % — ABNORMAL LOW (ref 24–39)
T4, Total: 0.8 ug/dL — CL (ref 4.5–12.0)
TSH: 181 u[IU]/mL — ABNORMAL HIGH (ref 0.450–4.500)

## 2019-07-03 MED ORDER — LEVOTHYROXINE SODIUM 50 MCG PO TABS: 50.0000 ug | ORAL_TABLET | Freq: Every day | ORAL | 0 refills | Status: DC

## 2019-07-03 NOTE — Telephone Encounter (Signed)
Copied from [place] [phone]. Topic: General - Other >> Jul 03, 2019 12:28 PM Keene Breath wrote: Reason for CRM: Patient called to ask the doctor if she can cancel her appt. With the endocrinologist.  Please call her to give an answer.  CB# [phone]

## 2019-07-23 ENCOUNTER — Other Ambulatory Visit: Payer: Self-pay

## 2019-07-31 ENCOUNTER — Encounter: Payer: Self-pay | Admitting: Internal Medicine

## 2019-08-10 ENCOUNTER — Encounter: Payer: Self-pay | Admitting: Internal Medicine

## 2019-08-27 ENCOUNTER — Ambulatory Visit: Payer: Medicare Other | Admitting: Internal Medicine

## 2019-09-15 HISTORY — PX: TOTAL SHOULDER REPLACEMENT: SUR1217

## 2019-09-28 ENCOUNTER — Other Ambulatory Visit: Payer: Self-pay | Admitting: Internal Medicine

## 2019-09-28 DIAGNOSIS — E039 Hypothyroidism, unspecified: Secondary | ICD-10-CM

## 2019-10-22 ENCOUNTER — Other Ambulatory Visit: Payer: Self-pay | Admitting: Internal Medicine

## 2019-10-22 DIAGNOSIS — F5101 Primary insomnia: Secondary | ICD-10-CM

## 2019-10-22 NOTE — Telephone Encounter (Signed)
Requested medication (s) are due for refill today: yes  Requested medication (s) are on the active medication list: yes  Last refill:  04/21/19 #90 with 1 refill  Future visit scheduled: yes  Notes to clinic:  Please review for refill. Refill not delegated per protocol    Requested Prescriptions  Pending Prescriptions Disp Refills   zolpidem (AMBIEN) 10 MG tablet [Pharmacy Med Name: ZOLPIDEM TARTRATE 10 MG TABLET] 90 tablet     Sig: TAKE ONE TABLET BY MOUTH AT BEDTIME      Not Delegated - Psychiatry:  Anxiolytics/Hypnotics Failed - 10/22/2019  1:35 PM      Failed - This refill cannot be delegated      Failed - Urine Drug Screen completed in last 360 days.      Passed - Valid encounter within last 6 months    Recent Outpatient Visits           3 months ago Acquired hypothyroidism   [place], [place]   5 months ago Acquired hypothyroidism   [facility] Glean Hess, MD   7 months ago Annual physical exam   [facility] Glean Hess, MD   1 year ago Essential (primary) hypertension   [facility] Glean Hess, MD   1 year ago Essential (primary) hypertension   [facility] Glean Hess, MD       Future Appointments             In 5 months [name] Jesse Sans, MD [facility], [facility]

## 2019-10-23 NOTE — Telephone Encounter (Signed)
Please Advised. Last office visit 07/02/2019.  KP

## 2019-10-29 ENCOUNTER — Other Ambulatory Visit: Payer: Self-pay | Admitting: Internal Medicine

## 2019-10-29 NOTE — Telephone Encounter (Signed)
Requested Prescriptions  Pending Prescriptions Disp Refills  . TIADYLT ER 240 MG 24 hr capsule [Pharmacy Med Name: TIADYLT ER 240 MG CAPSULE] 90 capsule 3    Sig: TAKE 1 CAPSULE (240 MG TOTAL) BY MOUTH DAILY.     Cardiovascular:  Calcium Channel Blockers Passed - 10/29/2019  1:06 AM      Passed - Last BP in normal range    BP Readings from Last 1 Encounters:  07/02/19 126/80         Passed - Valid encounter within last 6 months    Recent Outpatient Visits          3 months ago Acquired hypothyroidism   [facility] Glean Hess, MD   5 months ago Acquired hypothyroidism   [facility] Glean Hess, MD   7 months ago Annual physical exam   [facility] Glean Hess, MD   1 year ago Essential (primary) hypertension   [facility] Glean Hess, MD   1 year ago Essential (primary) hypertension   [facility] Glean Hess, MD      Future Appointments            In 4 months [name] Jesse Sans, MD [facility], [facility]

## 2019-11-19 ENCOUNTER — Encounter: Payer: Self-pay | Admitting: Internal Medicine

## 2019-11-19 ENCOUNTER — Ambulatory Visit (INDEPENDENT_AMBULATORY_CARE_PROVIDER_SITE_OTHER): Payer: Medicare Other | Admitting: Internal Medicine

## 2019-11-19 ENCOUNTER — Other Ambulatory Visit: Payer: Self-pay

## 2019-11-19 VITALS — BP 112/62 | HR 87 | Ht 64.0 in | Wt 128.8 lb

## 2019-11-19 DIAGNOSIS — F5101 Primary insomnia: Secondary | ICD-10-CM | POA: Diagnosis not present

## 2019-11-19 DIAGNOSIS — E039 Hypothyroidism, unspecified: Secondary | ICD-10-CM | POA: Diagnosis not present

## 2019-11-19 DIAGNOSIS — I10 Essential (primary) hypertension: Secondary | ICD-10-CM

## 2019-11-19 MED ORDER — ZOLPIDEM TARTRATE ER 12.5 MG PO TBCR: 12.5000 mg | EXTENDED_RELEASE_TABLET | Freq: Every evening | ORAL | 0 refills | Status: DC | PRN

## 2019-11-19 NOTE — Progress Notes (Signed)
Date:  11/19/2019   Name:  Belinda Sanchez   DOB:  15-Nov-1940   MRN:  [id number]   Chief Complaint: Hypothyroidism (Follow up- 6 months.) and Flu Vaccine (High dose .)  Thyroid Problem Presents for follow-up (started back on higher dose) visit. Symptoms include hair loss and weight loss. Patient reports no anxiety, constipation, diaphoresis, diarrhea, fatigue or palpitations.  Hypertension Pertinent negatives include no chest pain, headaches, palpitations or shortness of breath. Identifiable causes of hypertension include a thyroid problem.  She was seen by [facility] in July.  Restarted the higher dose thyroid medication at the insistence of the surgeon who postponed her shoulder surgery.  She has stayed on 150 mcg 6 days per week. Her weight is relatively stable - at home around 130 lbs.  Lab Results  Component Value Date   CREATININE 0.68 03/19/2019   BUN 11 03/19/2019   NA 144 03/19/2019   K 4.4 03/19/2019   CL 103 03/19/2019   CO2 23 03/19/2019   Lab Results  Component Value Date   CHOL 231 (H) 03/19/2019   HDL 55 03/19/2019   LDLCALC 153 (H) 03/19/2019   TRIG 130 03/19/2019   CHOLHDL 4.2 03/19/2019   Lab Results  Component Value Date   TSH 181.000 (H) 07/02/2019   No results found for: HGBA1C Lab Results  Component Value Date   WBC 7.3 03/19/2019   HGB 14.5 03/19/2019   HCT 43.1 03/19/2019   MCV 90 03/19/2019   PLT 214 03/19/2019   Lab Results  Component Value Date   ALT 22 03/19/2019   AST 34 03/19/2019   ALKPHOS 71 03/19/2019   BILITOT 0.6 03/19/2019     Review of Systems  Constitutional: Positive for unexpected weight change and weight loss. Negative for diaphoresis, fatigue and fever.  Respiratory: Negative for cough, chest tightness and shortness of breath.   Cardiovascular: Negative for chest pain, palpitations and leg swelling.  Gastrointestinal: Negative for blood in stool, constipation and diarrhea.  Neurological: Negative for dizziness and  headaches.  Psychiatric/Behavioral: Positive for sleep disturbance. Negative for dysphoric mood. The patient is not nervous/anxious.     Patient Active Problem List   Diagnosis Date Noted  . Hepatic steatosis 08/24/2018  . Elevated LFTs 08/21/2018  . Rotator cuff tendonitis, right 08/20/2018  . Arthritis of foot, left 12/20/2016  . Inflammatory spondylopathy of lumbar region ([place]) 08/08/2015  . Herpes simplex infection 12/15/2014  . Acne erythematosa 12/03/2014  . Acquired hypothyroidism 12/03/2014  . Crohn's colitis, without complications ([place]) 24/58/0998  . Dyslipidemia 12/03/2014  . Essential (primary) hypertension 12/03/2014  . Idiopathic insomnia 12/03/2014  . Osteoarthritis of right knee 12/03/2014  . Avitaminosis D 12/03/2014  . History of breast cancer 06/27/2014    Allergies  Allergen Reactions  . Lozol  [Indapamide]     elevated serum calcium  . Loratadine Palpitations    Past Surgical History:  Procedure Laterality Date  . APPENDECTOMY    . BREAST EXCISIONAL BIOPSY Right 04/2014   benign  . COLONOSCOPY  08/2010   one polyp - repeat 5 yrs  . MASTECTOMY, RADICAL Left 2009  . SHOULDER SURGERY  2016   AVN  . TOTAL SHOULDER REPLACEMENT Right 09/2019  . VAGINAL HYSTERECTOMY     total    Social History   Tobacco Use  . Smoking status: Never Smoker  . Smokeless tobacco: Never Used  Vaping Use  . Vaping Use: Never used  Substance Use Topics  . Alcohol use:  Yes    Alcohol/week: 16.0 standard drinks    Types: 2 Standard drinks or equivalent, 14 Glasses of wine per week  . Drug use: No     Medication list has been reviewed and updated.  Current Meds  Medication Sig  . ammonium lactate (LAC-HYDRIN) 12 % lotion 1 GM APPLY ON THE SKIN DAILY  . BYSTOLIC 10 MG tablet TAKE ONE TABLET BY MOUTH DAILY  . Chromium Picolinate 800 MCG TABS Take by mouth.  . levothyroxine (SYNTHROID) 50 MCG tablet TAKE ONE TABLET BY MOUTH DAILY (Patient taking differently: Take 150  mcg by mouth daily before breakfast. Daily except for Sundays)  . losartan (COZAAR) 50 MG tablet TAKE ONE TABLET BY MOUTH DAILY  . TIADYLT ER 240 MG 24 hr capsule TAKE 1 CAPSULE (240 MG TOTAL) BY MOUTH DAILY.  . [DISCONTINUED] Acetylcarnitine HCl (ACETYL-L-CARNITINE HCL) POWD by Does not apply route.  . [DISCONTINUED] Flaxseed, Linseed, (FLAXSEED OIL) 1000 MG CAPS Take by mouth.  . [DISCONTINUED] L-Citrulline POWD by Does not apply route.  . [DISCONTINUED] zolpidem (AMBIEN) 10 MG tablet TAKE ONE TABLET BY MOUTH AT BEDTIME    PHQ 2/9 Scores 11/19/2019 07/02/2019 05/21/2019 03/19/2019  PHQ - 2 Score 0 3 0 0  PHQ- 9 Score 5 9 2 3     GAD 7 : Generalized Anxiety Score 11/19/2019 07/02/2019 05/21/2019  Nervous, Anxious, on Edge 0 0 0  Control/stop worrying 0 0 0  Worry too much - different things 0 0 0  Trouble relaxing 0 0 0  Restless 0 0 0  Easily annoyed or irritable 0 0 0  Afraid - awful might happen 0 0 0  Total GAD 7 Score 0 0 0  Anxiety Difficulty Not difficult at all Not difficult at all Not difficult at all    BP Readings from Last 3 Encounters:  11/19/19 112/62  07/02/19 126/80  05/21/19 128/74    Physical Exam Vitals and nursing note reviewed.  Constitutional:      General: She is not in acute distress.    Appearance: Normal appearance. She is well-developed.  HENT:     Head: Normocephalic and atraumatic.  Cardiovascular:     Rate and Rhythm: Normal rate and regular rhythm.     Pulses: Normal pulses.  Pulmonary:     Effort: Pulmonary effort is normal. No respiratory distress.     Breath sounds: No wheezing or rhonchi.  Musculoskeletal:     Cervical back: Normal range of motion.     Right lower leg: No edema.     Left lower leg: No edema.  Lymphadenopathy:     Cervical: No cervical adenopathy.  Skin:    General: Skin is warm and dry.     Capillary Refill: Capillary refill takes less than 2 seconds.     Findings: No rash.  Neurological:     General: No focal deficit  present.     Mental Status: She is alert and oriented to person, place, and time.  Psychiatric:        Mood and Affect: Mood normal.        Behavior: Behavior normal.     Wt Readings from Last 3 Encounters:  11/19/19 128 lb 12.8 oz (58.4 kg)  07/02/19 133 lb (60.3 kg)  05/21/19 131 lb (59.4 kg)    BP 112/62   Pulse 87   Ht 5' 4"  (1.626 m)   Wt 128 lb 12.8 oz (58.4 kg)   SpO2 95%   BMI 22.11 kg/m  Assessment and Plan: 1. Acquired hypothyroidism Now on 150 mcg 6 days per week; check labs and adjust with lab only follow up in 2 months - TSH + free T4  2. Essential (primary) hypertension Clinically stable exam with well controlled BP on bystolic and cardizem. Tolerating medications without side effects at this time. Pt to continue current regimen and low sodium diet; benefits of regular exercise as able discussed.  3. Idiopathic insomnia Not sleeping through the night on Ambien 10 mg - will try CR formulation - zolpidem (AMBIEN CR) 12.5 MG CR tablet; Take 1 tablet (12.5 mg total) by mouth at bedtime as needed for sleep.  Dispense: 30 tablet; Refill: 0   Partially dictated using Editor, commissioning. Any errors are unintentional.  Halina Maidens, MD [facility]  11/19/2019

## 2019-11-20 ENCOUNTER — Other Ambulatory Visit: Payer: Self-pay | Admitting: Internal Medicine

## 2019-11-20 ENCOUNTER — Encounter: Payer: Self-pay | Admitting: Internal Medicine

## 2019-11-20 DIAGNOSIS — E039 Hypothyroidism, unspecified: Secondary | ICD-10-CM

## 2019-11-20 LAB — TSH+FREE T4
Free T4: 2.31 ng/dL — ABNORMAL HIGH (ref 0.82–1.77)
TSH: 0.247 u[IU]/mL — ABNORMAL LOW (ref 0.450–4.500)

## 2019-11-20 MED ORDER — LEVOTHYROXINE SODIUM 75 MCG PO TABS: 75.0000 ug | ORAL_TABLET | Freq: Every day | ORAL | 0 refills | Status: DC

## 2019-12-19 ENCOUNTER — Other Ambulatory Visit: Payer: Self-pay | Admitting: Internal Medicine

## 2019-12-19 DIAGNOSIS — F5101 Primary insomnia: Secondary | ICD-10-CM

## 2019-12-19 NOTE — Telephone Encounter (Signed)
Requested medications are due for refill today yes  Requested medications are on the active medication list yes  Last refill 10/5  Last visit 11/2019  Future visit scheduled 01/14/20  Notes to clinic Not Delegated

## 2019-12-20 ENCOUNTER — Encounter: Payer: Self-pay | Admitting: Internal Medicine

## 2019-12-20 NOTE — Telephone Encounter (Signed)
Please Advise. Last office visit 11/19/2019.  KP

## 2019-12-25 ENCOUNTER — Other Ambulatory Visit: Payer: Self-pay | Admitting: Internal Medicine

## 2019-12-25 DIAGNOSIS — K5732 Diverticulitis of large intestine without perforation or abscess without bleeding: Secondary | ICD-10-CM

## 2019-12-25 MED ORDER — AMOXICILLIN-POT CLAVULANATE 875-125 MG PO TABS: 1.0000 | ORAL_TABLET | Freq: Two times a day (BID) | ORAL | 0 refills | Status: AC

## 2020-01-14 ENCOUNTER — Ambulatory Visit (INDEPENDENT_AMBULATORY_CARE_PROVIDER_SITE_OTHER): Payer: Medicare Other | Admitting: Internal Medicine

## 2020-01-14 ENCOUNTER — Other Ambulatory Visit: Payer: Self-pay

## 2020-01-14 ENCOUNTER — Encounter: Payer: Self-pay | Admitting: Internal Medicine

## 2020-01-14 VITALS — BP 124/68 | HR 60 | Temp 98.4°F | Ht 64.0 in | Wt 137.0 lb

## 2020-01-14 DIAGNOSIS — R10814 Left lower quadrant abdominal tenderness: Secondary | ICD-10-CM

## 2020-01-14 DIAGNOSIS — M4696 Unspecified inflammatory spondylopathy, lumbar region: Secondary | ICD-10-CM | POA: Diagnosis not present

## 2020-01-14 DIAGNOSIS — E039 Hypothyroidism, unspecified: Secondary | ICD-10-CM | POA: Diagnosis not present

## 2020-01-14 DIAGNOSIS — K5732 Diverticulitis of large intestine without perforation or abscess without bleeding: Secondary | ICD-10-CM

## 2020-01-14 DIAGNOSIS — K5010 Crohn's disease of large intestine without complications: Secondary | ICD-10-CM

## 2020-01-14 NOTE — Progress Notes (Signed)
Date:  01/14/2020   Name:  Belinda Sanchez   DOB:  12-Jan-1941   MRN:  [id number]   Chief Complaint: Hypothyroidism (f/u) and Abdominal Pain  Thyroid Problem Presents for follow-up visit. Symptoms include constipation and weight gain. Patient reports no depressed mood, diarrhea, fatigue, hoarse voice, leg swelling or palpitations. The symptoms have been stable.   Abdominal pain - she was treated for an episode of diverticulitis about three weeks ago.  She no longer has diarrhea or bleeding but still have LLQ pain and tenderness.  She denies fever or chills. She has a colonoscopy scheduled in three weeks but thinks that it will be cancelled due to her persistent sx.  She has a hx of Crohn's colitis but no evidence on last colonoscopy.  Lab Results  Component Value Date   CREATININE 0.68 03/19/2019   BUN 11 03/19/2019   NA 144 03/19/2019   K 4.4 03/19/2019   CL 103 03/19/2019   CO2 23 03/19/2019   Lab Results  Component Value Date   CHOL 231 (H) 03/19/2019   HDL 55 03/19/2019   LDLCALC 153 (H) 03/19/2019   TRIG 130 03/19/2019   CHOLHDL 4.2 03/19/2019   Lab Results  Component Value Date   TSH 0.247 (L) 11/19/2019   No results found for: HGBA1C Lab Results  Component Value Date   WBC 7.3 03/19/2019   HGB 14.5 03/19/2019   HCT 43.1 03/19/2019   MCV 90 03/19/2019   PLT 214 03/19/2019   Lab Results  Component Value Date   ALT 22 03/19/2019   AST 34 03/19/2019   ALKPHOS 71 03/19/2019   BILITOT 0.6 03/19/2019     Review of Systems  Constitutional: Positive for unexpected weight change (has gained about 6 lbs) and weight gain. Negative for chills, fatigue and fever.  HENT: Negative for hoarse voice.   Respiratory: Negative for cough, chest tightness, shortness of breath and wheezing.   Cardiovascular: Negative for chest pain, palpitations and leg swelling.  Gastrointestinal: Positive for abdominal pain and constipation. Negative for blood in stool, diarrhea and nausea.    Neurological: Negative for dizziness, light-headedness and headaches.    Patient Active Problem List   Diagnosis Date Noted  . Hepatic steatosis 08/24/2018  . Elevated LFTs 08/21/2018  . Rotator cuff tendonitis, right 08/20/2018  . Arthritis of foot, left 12/20/2016  . Inflammatory spondylopathy of lumbar region ([place]) 08/08/2015  . Herpes simplex infection 12/15/2014  . Acne erythematosa 12/03/2014  . Acquired hypothyroidism 12/03/2014  . Crohn's colitis, without complications ([place]) 03/50/0938  . Dyslipidemia 12/03/2014  . Essential (primary) hypertension 12/03/2014  . Idiopathic insomnia 12/03/2014  . Osteoarthritis of right knee 12/03/2014  . Avitaminosis D 12/03/2014  . History of breast cancer 06/27/2014    Allergies  Allergen Reactions  . Lozol  [Indapamide]     elevated serum calcium  . Loratadine Palpitations    Past Surgical History:  Procedure Laterality Date  . APPENDECTOMY    . BREAST EXCISIONAL BIOPSY Right 04/2014   benign  . COLONOSCOPY  08/2010   one polyp - repeat 5 yrs  . MASTECTOMY, RADICAL Left 2009  . SHOULDER SURGERY  2016   AVN  . TOTAL SHOULDER REPLACEMENT Right 09/2019  . VAGINAL HYSTERECTOMY     total    Social History   Tobacco Use  . Smoking status: Never Smoker  . Smokeless tobacco: Never Used  Vaping Use  . Vaping Use: Never used  Substance Use Topics  .  Alcohol use: Yes    Alcohol/week: 16.0 standard drinks    Types: 2 Standard drinks or equivalent, 14 Glasses of wine per week  . Drug use: No     Medication list has been reviewed and updated.  Current Meds  Medication Sig  . ammonium lactate (LAC-HYDRIN) 12 % lotion 1 GM APPLY ON THE SKIN DAILY  . BYSTOLIC 10 MG tablet TAKE ONE TABLET BY MOUTH DAILY  . levothyroxine (SYNTHROID) 75 MCG tablet Take 1 tablet (75 mcg total) by mouth daily.  Marland Kitchen losartan (COZAAR) 50 MG tablet TAKE ONE TABLET BY MOUTH DAILY  . TIADYLT ER 240 MG 24 hr capsule TAKE 1 CAPSULE (240 MG TOTAL) BY  MOUTH DAILY.  Marland Kitchen zolpidem (AMBIEN CR) 12.5 MG CR tablet TAKE ONE TABLET BY MOUTH  AT BEDTIME AS NEEDED FOR SLEEP    PHQ 2/9 Scores 01/14/2020 11/19/2019 07/02/2019 05/21/2019  PHQ - 2 Score 0 0 3 0  PHQ- 9 Score 0 5 9 2     GAD 7 : Generalized Anxiety Score 01/14/2020 11/19/2019 07/02/2019 05/21/2019  Nervous, Anxious, on Edge 0 0 0 0  Control/stop worrying 0 0 0 0  Worry too much - different things 0 0 0 0  Trouble relaxing 0 0 0 0  Restless 0 0 0 0  Easily annoyed or irritable 0 0 0 0  Afraid - awful might happen 0 0 0 0  Total GAD 7 Score 0 0 0 0  Anxiety Difficulty - Not difficult at all Not difficult at all Not difficult at all    BP Readings from Last 3 Encounters:  01/14/20 124/68  11/19/19 112/62  07/02/19 126/80    Physical Exam Vitals and nursing note reviewed.  Constitutional:      General: She is not in acute distress.    Appearance: She is well-developed.  HENT:     Head: Normocephalic and atraumatic.  Cardiovascular:     Rate and Rhythm: Normal rate and regular rhythm.     Heart sounds: Normal heart sounds.  Pulmonary:     Effort: Pulmonary effort is normal. No respiratory distress.  Abdominal:     General: Abdomen is flat. Bowel sounds are decreased.     Palpations: Abdomen is soft.     Tenderness: There is abdominal tenderness in the left lower quadrant. There is no guarding or rebound.  Musculoskeletal:        General: Normal range of motion.  Skin:    General: Skin is warm and dry.     Findings: No rash.  Neurological:     General: No focal deficit present.     Mental Status: She is alert and oriented to person, place, and time.  Psychiatric:        Mood and Affect: Mood normal.        Behavior: Behavior normal.        Thought Content: Thought content normal.     Wt Readings from Last 3 Encounters:  01/14/20 137 lb (62.1 kg)  11/19/19 128 lb 12.8 oz (58.4 kg)  07/02/19 133 lb (60.3 kg)    BP 124/68   Pulse 60   Temp 98.4 F (36.9 C) (Oral)    Ht 5' 4"  (1.626 m)   Wt 137 lb (62.1 kg)   SpO2 97%   BMI 23.52 kg/m   Assessment and Plan: 1. Acquired hypothyroidism Will continue to adjust the dose of levothyroxine.   She feels better with more appetite and some weight gain -  not excessive. - TSH+T4F+T3Free  2. Left lower quadrant abdominal tenderness without rebound tenderness S/p recent course of Augmentin for presumed diverticulitis. Will order CT abdomen/pelvis at [facility]  3. Diverticulitis of large intestine without bleeding, unspecified complication status Will check CBC She will likely need to postpone the scheduled Colonoscopy. - CBC with Differential/Platelet  4. Inflammatory spondylopathy of lumbar region ([place]) stable  5. Crohn's disease of colon without complication ([place]) Hx of this diagnosis in the past. Recommend CT scan to further evaluate the persistent discomfort   Partially dictated using Editor, commissioning. Any errors are unintentional.  Halina Maidens, MD [facility]  01/14/2020

## 2020-01-15 LAB — CBC WITH DIFFERENTIAL/PLATELET
Basophils Absolute: 0.1 10*3/uL (ref 0.0–0.2)
Basos: 1 %
EOS (ABSOLUTE): 0.1 10*3/uL (ref 0.0–0.4)
Eos: 2 %
Hematocrit: 42.5 % (ref 34.0–46.6)
Hemoglobin: 14.2 g/dL (ref 11.1–15.9)
Immature Grans (Abs): 0 10*3/uL (ref 0.0–0.1)
Immature Granulocytes: 0 %
Lymphocytes Absolute: 2.3 10*3/uL (ref 0.7–3.1)
Lymphs: 32 %
MCH: 31.2 pg (ref 26.6–33.0)
MCHC: 33.4 g/dL (ref 31.5–35.7)
MCV: 93 fL (ref 79–97)
Monocytes Absolute: 0.6 10*3/uL (ref 0.1–0.9)
Monocytes: 8 %
Neutrophils Absolute: 4.1 10*3/uL (ref 1.4–7.0)
Neutrophils: 57 %
Platelets: 180 10*3/uL (ref 150–450)
RBC: 4.55 x10E6/uL (ref 3.77–5.28)
RDW: 15 % (ref 11.7–15.4)
WBC: 7.1 10*3/uL (ref 3.4–10.8)

## 2020-01-15 LAB — TSH+T4F+T3FREE
Free T4: 1.11 ng/dL (ref 0.82–1.77)
T3, Free: 1.5 pg/mL — ABNORMAL LOW (ref 2.0–4.4)
TSH: 15.3 u[IU]/mL — ABNORMAL HIGH (ref 0.450–4.500)

## 2020-01-16 ENCOUNTER — Other Ambulatory Visit: Payer: Self-pay | Admitting: Internal Medicine

## 2020-01-16 DIAGNOSIS — I10 Essential (primary) hypertension: Secondary | ICD-10-CM

## 2020-01-21 ENCOUNTER — Telehealth: Payer: Self-pay | Admitting: Internal Medicine

## 2020-01-21 NOTE — Telephone Encounter (Signed)
Called patient to discuss CT findings.  There is mod-severe diverticulosis with peritoneal thickening  and a tiny amount of pericolic fluid.  She continues to have discomfort and cancelled her colonoscopy.  The CT was to be forwarded to Dr. Comer Locket and the patient will reach out to his office for advice regarding more antibiotic therapy.

## 2020-01-28 ENCOUNTER — Other Ambulatory Visit: Payer: Self-pay | Admitting: Internal Medicine

## 2020-02-07 ENCOUNTER — Other Ambulatory Visit: Payer: Self-pay | Admitting: Internal Medicine

## 2020-02-07 DIAGNOSIS — E039 Hypothyroidism, unspecified: Secondary | ICD-10-CM

## 2020-02-09 NOTE — Telephone Encounter (Signed)
Requested Prescriptions  Pending Prescriptions Disp Refills   levothyroxine (SYNTHROID) 75 MCG tablet [Pharmacy Med Name: LEVOTHYROXINE 75 MCG TABLET] 90 tablet 0    Sig: TAKE ONE TABLET BY MOUTH DAILY     Endocrinology:  Hypothyroid Agents Failed - 02/07/2020  9:02 AM      Failed - TSH needs to be rechecked within 3 months after an abnormal result. Refill until TSH is due.      Failed - TSH in normal range and within 360 days    TSH  Date Value Ref Range Status  01/14/2020 15.300 (H) 0.450 - 4.500 uIU/mL Final         Passed - Valid encounter within last 12 months    Recent Outpatient Visits          3 weeks ago Acquired hypothyroidism   [facility] Glean Hess, MD   2 months ago Acquired hypothyroidism   [facility] Glean Hess, MD   7 months ago Acquired hypothyroidism   [facility] Glean Hess, MD   8 months ago Acquired hypothyroidism   [facility] Glean Hess, MD   10 months ago Annual physical exam   [facility] Glean Hess, MD      Future Appointments            In 2 months [name] Jesse Sans, MD [facility], [facility]

## 2020-02-24 ENCOUNTER — Telehealth: Payer: Self-pay

## 2020-02-24 NOTE — Telephone Encounter (Unsigned)
Copied from [place] [id number]. Topic: General - Other >> Feb 24, 2020  9:13 AM Oneta Rack wrote: Reason for CRM: patient experiencing constant hand, shoulder and knee arthritis, offered patient in office or virtual appointment, patient declined due to her cataracts, patient seeking clinical advice.

## 2020-02-24 NOTE — Telephone Encounter (Signed)
Patient only taken 500 mg of tylenol daily so will up the dose.

## 2020-02-24 NOTE — Telephone Encounter (Signed)
She can take Aleve 1-2 tabs twice a day OR tylenol 650 mg four times per day - both as needed

## 2020-03-19 ENCOUNTER — Other Ambulatory Visit: Payer: Self-pay

## 2020-03-24 ENCOUNTER — Encounter: Payer: Medicare Other | Admitting: Internal Medicine

## 2020-04-09 ENCOUNTER — Ambulatory Visit (INDEPENDENT_AMBULATORY_CARE_PROVIDER_SITE_OTHER): Payer: Medicare Other | Admitting: Internal Medicine

## 2020-04-09 ENCOUNTER — Other Ambulatory Visit: Payer: Self-pay

## 2020-04-09 ENCOUNTER — Encounter: Payer: Self-pay | Admitting: Internal Medicine

## 2020-04-09 VITALS — BP 118/74 | HR 51 | Temp 98.0°F | Ht 64.0 in | Wt 140.0 lb

## 2020-04-09 DIAGNOSIS — E039 Hypothyroidism, unspecified: Secondary | ICD-10-CM

## 2020-04-09 DIAGNOSIS — E785 Hyperlipidemia, unspecified: Secondary | ICD-10-CM | POA: Diagnosis not present

## 2020-04-09 DIAGNOSIS — K5010 Crohn's disease of large intestine without complications: Secondary | ICD-10-CM | POA: Diagnosis not present

## 2020-04-09 DIAGNOSIS — M4696 Unspecified inflammatory spondylopathy, lumbar region: Secondary | ICD-10-CM

## 2020-04-09 DIAGNOSIS — I10 Essential (primary) hypertension: Secondary | ICD-10-CM

## 2020-04-09 DIAGNOSIS — M130 Polyarthritis, unspecified: Secondary | ICD-10-CM

## 2020-04-09 NOTE — Progress Notes (Signed)
Date:  04/09/2020   Name:  Belinda Sanchez   DOB:  1940/11/20   MRN:  [id number]   Chief Complaint: Annual Exam (Breast exam no pap)  Belinda Sanchez is a 80 y.o. female who presents today for her Complete Annual Exam. She feels well. She reports exercising none. She reports she is sleeping fairly well. Breast complaints none. She recently has right cataract surgery and plans it on the left in march.  She has been frustrated by the rapid onset and the life limiting sight of the past few months.  Mammogram: 07/2018 DEXA: 07/2012 Pap smear: discontinued Colonoscopy: 11/2015 Dr. Comer Locket  Immunization History  Administered Date(s) Administered  . Fluad Quad(high Dose 65+) 11/19/2019  . Influenza,inj,Quad PF,6+ Mos 12/15/2014, 10/26/2015, 10/16/2018  . Influenza-Unspecified 11/22/2016  . PFIZER(Purple Top)SARS-COV-2 Vaccination 02/26/2019, 03/21/2019, 04/11/2019  . Pneumococcal Conjugate-13 12/23/2013  . Pneumococcal Polysaccharide-23 02/16/2007, 12/10/2012  . Tdap 05/27/2013  . Zoster 02/15/2010  . Zoster Recombinat (Shingrix) 07/23/2016, 01/07/2017    Hypertension This is a chronic problem. The problem is controlled. Pertinent negatives include no chest pain, headaches, palpitations or shortness of breath. Past treatments include beta blockers and angiotensin blockers. The current treatment provides significant improvement. There are no compliance problems.  Identifiable causes of hypertension include a thyroid problem. There is no history of a hypertension causing med.  Thyroid Problem Presents for follow-up visit. Patient reports no anxiety, constipation, diarrhea, fatigue, palpitations or tremors. The symptoms have been stable (weight has stabilized). Her past medical history is significant for hyperlipidemia.  Hyperlipidemia This is a chronic problem. The problem is uncontrolled. Pertinent negatives include no chest pain or shortness of breath. Current antihyperlipidemic treatment  includes diet change. The current treatment provides mild improvement of lipids.  Joint pain - she has a number of joint pains attributed to OA.  However over the past few months she has more pain in her hands and knees.  The MCP joints are swollen and tender. Her Ortho prescribed Mobic 15 mg per day which she has taken for the past week with questionable benefit.  Lab Results  Component Value Date   CREATININE 0.68 03/19/2019   BUN 11 03/19/2019   NA 144 03/19/2019   K 4.4 03/19/2019   CL 103 03/19/2019   CO2 23 03/19/2019   Lab Results  Component Value Date   CHOL 231 (H) 03/19/2019   HDL 55 03/19/2019   LDLCALC 153 (H) 03/19/2019   TRIG 130 03/19/2019   CHOLHDL 4.2 03/19/2019   Lab Results  Component Value Date   TSH 15.300 (H) 01/14/2020   No results found for: HGBA1C Lab Results  Component Value Date   WBC 7.1 01/14/2020   HGB 14.2 01/14/2020   HCT 42.5 01/14/2020   MCV 93 01/14/2020   PLT 180 01/14/2020   Lab Results  Component Value Date   ALT 22 03/19/2019   AST 34 03/19/2019   ALKPHOS 71 03/19/2019   BILITOT 0.6 03/19/2019     Review of Systems  Constitutional: Negative for chills, fatigue and fever.  HENT: Negative for congestion, hearing loss, tinnitus, trouble swallowing and voice change.   Eyes: Positive for visual disturbance.  Respiratory: Negative for cough, chest tightness, shortness of breath and wheezing.   Cardiovascular: Negative for chest pain, palpitations and leg swelling.  Gastrointestinal: Negative for abdominal pain, constipation, diarrhea and vomiting.  Endocrine: Negative for polydipsia and polyuria.  Genitourinary: Negative for dysuria, frequency, genital sores, vaginal bleeding and vaginal discharge.  Musculoskeletal: Positive  for arthralgias and joint swelling. Negative for gait problem.  Skin: Negative for color change and rash.  Neurological: Negative for dizziness, tremors, light-headedness and headaches.  Hematological:  Negative for adenopathy. Does not bruise/bleed easily.  Psychiatric/Behavioral: Positive for dysphoric mood. Negative for sleep disturbance. The patient is not nervous/anxious.     Patient Active Problem List   Diagnosis Date Noted  . Hepatic steatosis 08/24/2018  . Elevated LFTs 08/21/2018  . Rotator cuff tendonitis, right 08/20/2018  . Arthritis of foot, left 12/20/2016  . 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 Herpes simplex infection 12/15/2014  . Acne erythematosa 12/03/2014  . Acquired hypothyroidism 12/03/2014  . Crohn's colitis, without complications ([place]) 38/75/6433  . Dyslipidemia 12/03/2014  . Essential (primary) hypertension 12/03/2014  . Idiopathic insomnia 12/03/2014  . Osteoarthritis of right knee 12/03/2014  . Avitaminosis D 12/03/2014  . History of breast cancer 06/27/2014    Allergies  Allergen Reactions  . Lozol  [Indapamide]     elevated serum calcium  . Loratadine Palpitations    Past Surgical History:  Procedure Laterality Date  . APPENDECTOMY    . BREAST EXCISIONAL BIOPSY Right 04/2014   benign  . COLONOSCOPY  08/2010   one polyp - repeat 5 yrs  . EYE SURGERY Right 04/02/2019   cataract   . MASTECTOMY, RADICAL Left 2009  . SHOULDER SURGERY  2016   AVN  . TOTAL SHOULDER REPLACEMENT Right 09/2019  . VAGINAL HYSTERECTOMY     total    Social History   Tobacco Use  . Smoking status: Never Smoker  . Smokeless tobacco: Never Used  Vaping Use  . Vaping Use: Never used  Substance Use Topics  . Alcohol use: Yes    Alcohol/week: 16.0 standard drinks    Types: 2 Standard drinks or equivalent, 14 Glasses of wine per week  . Drug use: No     Medication list has been reviewed and updated.  Current Meds  Medication Sig  . ammonium lactate (LAC-HYDRIN) 12 % lotion daily.  Marland Kitchen BYSTOLIC 10 MG tablet TAKE ONE TABLET BY MOUTH DAILY  . levothyroxine (SYNTHROID) 75 MCG tablet TAKE ONE TABLET BY MOUTH DAILY  . losartan (COZAAR)  50 MG tablet TAKE ONE TABLET BY MOUTH DAILY  . meloxicam (MOBIC) 15 MG tablet Take 15 mg by mouth daily.  Belinda Sanchez 240 MG 24 hr capsule TAKE 1 CAPSULE (240 MG TOTAL) BY MOUTH DAILY.  Marland Kitchen VITAMIN D PO Take by mouth.  . zolpidem (AMBIEN CR) 12.5 MG CR tablet TAKE ONE TABLET BY MOUTH  AT BEDTIME AS NEEDED FOR SLEEP    PHQ 2/9 Scores 04/09/2020 01/14/2020 11/19/2019 07/02/2019  PHQ - 2 Score 2 0 0 3  PHQ- 9 Score 4 0 5 9    GAD 7 : Generalized Anxiety Score 04/09/2020 01/14/2020 11/19/2019 07/02/2019  Nervous, Anxious, on Edge 0 0 0 0  Control/stop worrying 0 0 0 0  Worry too much - different things 0 0 0 0  Trouble relaxing 0 0 0 0  Restless 0 0 0 0  Easily annoyed or irritable 0 0 0 0  Afraid - awful might happen 0 0 0 0  Total GAD 7 Score 0 0 0 0  Anxiety Difficulty - - Not difficult at all Not difficult at all    BP Readings from Last 3 Encounters:  04/09/20 118/74  01/14/20 124/68  11/19/19 112/62    Physical Exam Vitals and nursing note reviewed.  Constitutional:  General: She is not in acute distress.    Appearance: She is well-developed.  HENT:     Head: Normocephalic and atraumatic.     Right Ear: Tympanic membrane and ear canal normal.     Left Ear: Tympanic membrane and ear canal normal.     Nose:     Right Sinus: No maxillary sinus tenderness.     Left Sinus: No maxillary sinus tenderness.  Eyes:     General: No scleral icterus.       Right eye: No discharge.        Left eye: No discharge.     Conjunctiva/sclera: Conjunctivae normal.  Neck:     Thyroid: No thyromegaly.     Vascular: No carotid bruit.  Cardiovascular:     Rate and Rhythm: Normal rate and regular rhythm.     Pulses: Normal pulses.     Heart sounds: Normal heart sounds.  Pulmonary:     Effort: Pulmonary effort is normal. No respiratory distress.     Breath sounds: No wheezing.  Abdominal:     General: Bowel sounds are normal.     Palpations: Abdomen is soft.     Tenderness: There is no  abdominal tenderness.  Musculoskeletal:     Right hand: Swelling (MCP joints) present.     Left hand: Swelling (MCP joints) present.     Cervical back: Normal range of motion. No erythema.     Right knee: No swelling or effusion. Decreased range of motion.     Left knee: No swelling or effusion. Decreased range of motion.     Right lower leg: No edema.     Left lower leg: No edema.  Lymphadenopathy:     Cervical: No cervical adenopathy.  Skin:    General: Skin is warm and dry.     Findings: No rash.  Neurological:     Mental Status: She is alert and oriented to person, place, and time.     Cranial Nerves: No cranial nerve deficit.     Sensory: No sensory deficit.     Deep Tendon Reflexes: Reflexes are normal and symmetric.  Psychiatric:        Attention and Perception: Attention normal.        Mood and Affect: Mood normal.     Wt Readings from Last 3 Encounters:  04/09/20 140 lb (63.5 kg)  01/14/20 137 lb (62.1 kg)  11/19/19 128 lb 12.8 oz (58.4 kg)    BP 118/74   Pulse (!) 51   Temp 98 F (36.7 C) (Oral)   Ht 5' 4"  (1.626 m)   Wt 140 lb (63.5 kg)   SpO2 95%   BMI 24.03 kg/m   Assessment and Plan: 1. Essential (primary) hypertension Clinically stable exam with well controlled BP. Tolerating medications without side effects at this time. Pt to continue current regimen and low sodium diet; benefits of regular exercise as able discussed. - CBC with Differential/Platelet - Comprehensive metabolic panel  2. Acquired hypothyroidism Still adjusting her dose - currently on 75 mcg daily plus 37.5 mcg twice a week - TSH+T4F+T3Free  3. Crohn's colitis, without complications ([place]) Stable without active sx Follow up with GI this summer - likely due for repeat colonoscopy  4. Dyslipidemia Check labs - Lipid panel  5. Arthritis of multiple sites May have an inflammatory arthritis - esp with her hx of #6 Will get screening labs Continue Mobic - consider trial of  Celebrex if no improvement - Sedimentation rate -  Rheumatoid factor - ANA w/Reflex if Positive  6. Inflammatory spondylopathy of lumbar region [facility])    Partially dictated using Editor, commissioning. Any errors are unintentional.  Halina Maidens, MD [facility]  04/09/2020

## 2020-04-09 NOTE — Telephone Encounter (Signed)
Please review.  KP

## 2020-04-10 ENCOUNTER — Telehealth: Payer: Self-pay

## 2020-04-10 ENCOUNTER — Other Ambulatory Visit: Payer: Self-pay

## 2020-04-10 ENCOUNTER — Other Ambulatory Visit: Payer: Self-pay | Admitting: Internal Medicine

## 2020-04-10 DIAGNOSIS — R768 Other specified abnormal immunological findings in serum: Secondary | ICD-10-CM

## 2020-04-10 DIAGNOSIS — M130 Polyarthritis, unspecified: Secondary | ICD-10-CM

## 2020-04-10 LAB — CBC WITH DIFFERENTIAL/PLATELET
Basophils Absolute: 0.1 10*3/uL (ref 0.0–0.2)
Basos: 1 %
EOS (ABSOLUTE): 0.1 10*3/uL (ref 0.0–0.4)
Eos: 2 %
Hematocrit: 42.7 % (ref 34.0–46.6)
Hemoglobin: 14.6 g/dL (ref 11.1–15.9)
Immature Grans (Abs): 0 10*3/uL (ref 0.0–0.1)
Immature Granulocytes: 1 %
Lymphocytes Absolute: 1.8 10*3/uL (ref 0.7–3.1)
Lymphs: 27 %
MCH: 31.7 pg (ref 26.6–33.0)
MCHC: 34.2 g/dL (ref 31.5–35.7)
MCV: 93 fL (ref 79–97)
Monocytes Absolute: 0.7 10*3/uL (ref 0.1–0.9)
Monocytes: 10 %
Neutrophils Absolute: 3.9 10*3/uL (ref 1.4–7.0)
Neutrophils: 59 %
Platelets: 197 10*3/uL (ref 150–450)
RBC: 4.6 x10E6/uL (ref 3.77–5.28)
RDW: 11.1 % — ABNORMAL LOW (ref 11.7–15.4)
WBC: 6.7 10*3/uL (ref 3.4–10.8)

## 2020-04-10 LAB — LIPID PANEL
Chol/HDL Ratio: 2.5 ratio (ref 0.0–4.4)
Cholesterol, Total: 251 mg/dL — ABNORMAL HIGH (ref 100–199)
HDL: 101 mg/dL (ref >39)
LDL Chol Calc (NIH): 134 mg/dL — ABNORMAL HIGH (ref 0–99)
Triglycerides: 94 mg/dL (ref 0–149)
VLDL Cholesterol Cal: 16 mg/dL (ref 5–40)

## 2020-04-10 LAB — COMPREHENSIVE METABOLIC PANEL
ALT: 16 IU/L (ref 0–32)
AST: 22 IU/L (ref 0–40)
Albumin/Globulin Ratio: 1.5 (ref 1.2–2.2)
Albumin: 4.3 g/dL (ref 3.7–4.7)
Alkaline Phosphatase: 130 IU/L — ABNORMAL HIGH (ref 44–121)
BUN/Creatinine Ratio: 21 (ref 12–28)
BUN: 15 mg/dL (ref 8–27)
Bilirubin Total: 0.6 mg/dL (ref 0.0–1.2)
CO2: 22 mmol/L (ref 20–29)
Calcium: 9.9 mg/dL (ref 8.7–10.3)
Chloride: 97 mmol/L (ref 96–106)
Creatinine, Ser: 0.7 mg/dL (ref 0.57–1.00)
GFR calc Af Amer: 97 mL/min/{1.73_m2} (ref >59)
GFR calc non Af Amer: 84 mL/min/{1.73_m2} (ref >59)
Globulin, Total: 2.8 g/dL (ref 1.5–4.5)
Glucose: 93 mg/dL (ref 65–99)
Potassium: 4.3 mmol/L (ref 3.5–5.2)
Sodium: 137 mmol/L (ref 134–144)
Total Protein: 7.1 g/dL (ref 6.0–8.5)

## 2020-04-10 LAB — RHEUMATOID FACTOR: Rheumatoid fact SerPl-aCnc: 598.4 IU/mL — ABNORMAL HIGH (ref <14.0)

## 2020-04-10 LAB — TSH+T4F+T3FREE
Free T4: 1.33 ng/dL (ref 0.82–1.77)
T3, Free: 1.9 pg/mL — ABNORMAL LOW (ref 2.0–4.4)
TSH: 14 u[IU]/mL — ABNORMAL HIGH (ref 0.450–4.500)

## 2020-04-10 LAB — SEDIMENTATION RATE: Sed Rate: 24 mm/hr (ref 0–40)

## 2020-04-10 LAB — ANA W/REFLEX IF POSITIVE: Anti Nuclear Antibody (ANA): NEGATIVE

## 2020-04-10 MED ORDER — CELECOXIB 200 MG PO CAPS: 200.0000 mg | ORAL_CAPSULE | Freq: Two times a day (BID) | ORAL | 1 refills | Status: DC

## 2020-04-10 NOTE — Telephone Encounter (Signed)
Pt wants another medication sent in pt states meloxicam is not working (Dr. Chaney Born). Pt said she spoke to Dr. [name] about taking a sulfur medication.  KP

## 2020-04-11 ENCOUNTER — Encounter: Payer: Self-pay | Admitting: Internal Medicine

## 2020-04-14 ENCOUNTER — Other Ambulatory Visit: Payer: Self-pay

## 2020-04-17 ENCOUNTER — Encounter: Payer: Self-pay | Admitting: Internal Medicine

## 2020-04-17 ENCOUNTER — Other Ambulatory Visit: Payer: Self-pay | Admitting: Internal Medicine

## 2020-04-17 ENCOUNTER — Telehealth: Payer: Self-pay

## 2020-04-17 MED ORDER — PREDNISONE 10 MG PO TABS: ORAL_TABLET | ORAL | 0 refills | Status: DC

## 2020-04-17 NOTE — Telephone Encounter (Signed)
Contacted Parke Poisson in referrals team. Let her know that [facility] was not accepting any new pts. She placed a Rf for [facility] in [place].  Called pt left a message that [facility] in [place] is not accepting any new pts. Let pt know that a referral was placed for [facility] in [place] to see a Rheumatologist. Gave pt number to call to see if she can schedule an appt. Could take up to 7 business days for someone to call her to set up an appt.   Phone number- [phone]  KP

## 2020-04-17 NOTE — Telephone Encounter (Signed)
Pt response.  KP

## 2020-04-22 ENCOUNTER — Other Ambulatory Visit: Payer: Self-pay | Admitting: Internal Medicine

## 2020-04-22 DIAGNOSIS — E039 Hypothyroidism, unspecified: Secondary | ICD-10-CM

## 2020-04-24 ENCOUNTER — Other Ambulatory Visit: Payer: Self-pay | Admitting: Internal Medicine

## 2020-04-24 DIAGNOSIS — R768 Other specified abnormal immunological findings in serum: Secondary | ICD-10-CM

## 2020-04-24 DIAGNOSIS — I10 Essential (primary) hypertension: Secondary | ICD-10-CM

## 2020-04-24 MED ORDER — PREDNISONE 5 MG PO TABS: 5.0000 mg | ORAL_TABLET | Freq: Every day | ORAL | 0 refills | Status: DC

## 2020-04-24 NOTE — Telephone Encounter (Signed)
Pt response.  KP

## 2020-05-20 ENCOUNTER — Other Ambulatory Visit: Payer: Self-pay | Admitting: Internal Medicine

## 2020-05-20 DIAGNOSIS — I10 Essential (primary) hypertension: Secondary | ICD-10-CM

## 2020-06-10 ENCOUNTER — Ambulatory Visit: Payer: Self-pay | Admitting: Internal Medicine

## 2020-06-11 ENCOUNTER — Other Ambulatory Visit: Payer: Self-pay

## 2020-06-11 ENCOUNTER — Ambulatory Visit (INDEPENDENT_AMBULATORY_CARE_PROVIDER_SITE_OTHER): Payer: Medicare Other | Admitting: Internal Medicine

## 2020-06-11 ENCOUNTER — Encounter: Payer: Self-pay | Admitting: Internal Medicine

## 2020-06-11 VITALS — BP 104/78 | HR 50 | Temp 98.5°F | Ht 64.0 in | Wt 143.0 lb

## 2020-06-11 DIAGNOSIS — M0579 Rheumatoid arthritis with rheumatoid factor of multiple sites without organ or systems involvement: Secondary | ICD-10-CM | POA: Insufficient documentation

## 2020-06-11 DIAGNOSIS — E039 Hypothyroidism, unspecified: Secondary | ICD-10-CM | POA: Diagnosis not present

## 2020-06-11 DIAGNOSIS — I10 Essential (primary) hypertension: Secondary | ICD-10-CM | POA: Diagnosis not present

## 2020-06-11 DIAGNOSIS — F5101 Primary insomnia: Secondary | ICD-10-CM

## 2020-06-11 DIAGNOSIS — M069 Rheumatoid arthritis, unspecified: Secondary | ICD-10-CM | POA: Insufficient documentation

## 2020-06-11 MED ORDER — ZOLPIDEM TARTRATE ER 12.5 MG PO TBCR: 12.5000 mg | EXTENDED_RELEASE_TABLET | Freq: Every evening | ORAL | 5 refills | Status: DC | PRN

## 2020-06-11 NOTE — Progress Notes (Signed)
Date:  06/11/2020   Name:  Belinda Sanchez   DOB:  Jan 13, 1941   MRN:  [id number]   Chief Complaint: Hypothyroidism  Thyroid Problem Presents for follow-up visit. Patient reports no constipation, diarrhea, fatigue or palpitations. The symptoms have been improving (now on 75 mcg daily plus 1/2 tab MWF).  Hypertension This is a chronic problem. The problem is controlled. Pertinent negatives include no chest pain, headaches, palpitations or shortness of breath. Past treatments include beta blockers, angiotensin blockers and calcium channel blockers. The current treatment provides significant improvement. Identifiable causes of hypertension include a thyroid problem.   RA - new diagnosis; now on prednisone, MTX and Plaquenil.  Her joint pain is much improved.  She has some mild abdominal bloating in the evening - unsure if this is a medication side effect.   Lab Results  Component Value Date   CREATININE 0.70 04/09/2020   BUN 15 04/09/2020   NA 137 04/09/2020   K 4.3 04/09/2020   CL 97 04/09/2020   CO2 22 04/09/2020   Lab Results  Component Value Date   CHOL 251 (H) 04/09/2020   HDL 101 04/09/2020   LDLCALC 134 (H) 04/09/2020   TRIG 94 04/09/2020   CHOLHDL 2.5 04/09/2020   Lab Results  Component Value Date   TSH 14.000 (H) 04/09/2020   No results found for: HGBA1C Lab Results  Component Value Date   WBC 6.7 04/09/2020   HGB 14.6 04/09/2020   HCT 42.7 04/09/2020   MCV 93 04/09/2020   PLT 197 04/09/2020   Lab Results  Component Value Date   ALT 16 04/09/2020   AST 22 04/09/2020   ALKPHOS 130 (H) 04/09/2020   BILITOT 0.6 04/09/2020     Review of Systems  Constitutional: Negative for fatigue and unexpected weight change.  HENT: Negative for nosebleeds.   Eyes: Negative for visual disturbance.  Respiratory: Negative for cough, chest tightness, shortness of breath and wheezing.   Cardiovascular: Negative for chest pain, palpitations and leg swelling.   Gastrointestinal: Negative for abdominal pain, constipation and diarrhea.  Musculoskeletal: Positive for arthralgias.  Neurological: Negative for dizziness, weakness, light-headedness and headaches.    Patient Active Problem List   Diagnosis Date Noted  . Hepatic steatosis 08/24/2018  . Elevated LFTs 08/21/2018  . Rotator cuff tendonitis, right 08/20/2018  . Arthritis of foot, left 12/20/2016  . Inflammatory spondylopathy of lumbar region ([place]) 08/08/2015  . Herpes simplex infection 12/15/2014  . Acne erythematosa 12/03/2014  . Acquired hypothyroidism 12/03/2014  . Crohn's colitis, without complications ([place]) 78/46/9629  . Dyslipidemia 12/03/2014  . Essential (primary) hypertension 12/03/2014  . Idiopathic insomnia 12/03/2014  . Osteoarthritis of right knee 12/03/2014  . Avitaminosis D 12/03/2014  . History of breast cancer 06/27/2014    Allergies  Allergen Reactions  . Lozol  [Indapamide]     elevated serum calcium  . Loratadine Palpitations    Past Surgical History:  Procedure Laterality Date  . APPENDECTOMY    . BREAST EXCISIONAL BIOPSY Right 04/2014   benign  . COLONOSCOPY  08/2010   one polyp - repeat 5 yrs  . EYE SURGERY Right 04/02/2019   cataract   . MASTECTOMY, RADICAL Left 2009  . SHOULDER SURGERY  2016   AVN  . TOTAL SHOULDER REPLACEMENT Right 09/2019  . VAGINAL HYSTERECTOMY     total    Social History   Tobacco Use  . Smoking status: Never Smoker  . Smokeless tobacco: Never Used  Vaping Use  .  Vaping Use: Never used  Substance Use Topics  . Alcohol use: Yes    Alcohol/week: 16.0 standard drinks    Types: 2 Standard drinks or equivalent, 14 Glasses of wine per week  . Drug use: No     Medication list has been reviewed and updated.  Current Meds  Medication Sig  . folic acid (FOLVITE) 1 MG tablet Take by mouth.  . hydroxychloroquine (PLAQUENIL) 200 MG tablet Take by mouth.  . levothyroxine (SYNTHROID) 75 MCG tablet TAKE ONE TABLET BY  MOUTH DAILY  . losartan (COZAAR) 50 MG tablet TAKE ONE TABLET BY MOUTH DAILY  . methotrexate (RHEUMATREX) 2.5 MG tablet Take by mouth.  . nebivolol (BYSTOLIC) 10 MG tablet TAKE ONE TABLET BY MOUTH DAILY  . predniSONE (DELTASONE) 10 MG tablet Take by mouth.  Deborah Chalk ER 240 MG 24 hr capsule TAKE 1 CAPSULE (240 MG TOTAL) BY MOUTH DAILY.  Marland Kitchen VITAMIN D PO Take by mouth.  . zolpidem (AMBIEN CR) 12.5 MG CR tablet TAKE ONE TABLET BY MOUTH  AT BEDTIME AS NEEDED FOR SLEEP    PHQ 2/9 Scores 06/11/2020 04/09/2020 01/14/2020 11/19/2019  PHQ - 2 Score 0 2 0 0  PHQ- 9 Score 0 4 0 5    GAD 7 : Generalized Anxiety Score 06/11/2020 04/09/2020 01/14/2020 11/19/2019  Nervous, Anxious, on Edge 1 0 0 0  Control/stop worrying 1 0 0 0  Worry too much - different things 1 0 0 0  Trouble relaxing 0 0 0 0  Restless 0 0 0 0  Easily annoyed or irritable 0 0 0 0  Afraid - awful might happen 0 0 0 0  Total GAD 7 Score 3 0 0 0  Anxiety Difficulty - - - Not difficult at all    BP Readings from Last 3 Encounters:  06/11/20 104/78  04/09/20 118/74  01/14/20 124/68    Physical Exam Vitals and nursing note reviewed.  Constitutional:      General: She is not in acute distress.    Appearance: Normal appearance. She is well-developed.  HENT:     Head: Normocephalic and atraumatic.  Cardiovascular:     Rate and Rhythm: Regular rhythm. Bradycardia present.     Pulses: Normal pulses.     Heart sounds: No murmur heard.   Pulmonary:     Effort: Pulmonary effort is normal. No respiratory distress.     Breath sounds: No wheezing or rhonchi.  Musculoskeletal:     Cervical back: Normal range of motion. No tenderness.     Right lower leg: No edema.     Left lower leg: No edema.  Lymphadenopathy:     Cervical: No cervical adenopathy.  Skin:    General: Skin is warm and dry.     Findings: No rash.  Neurological:     Mental Status: She is alert and oriented to person, place, and time.  Psychiatric:        Mood  and Affect: Mood normal.        Behavior: Behavior normal.     Wt Readings from Last 3 Encounters:  06/11/20 143 lb (64.9 kg)  04/09/20 140 lb (63.5 kg)  01/14/20 137 lb (62.1 kg)    BP 104/78   Pulse (!) 50   Temp 98.5 F (36.9 C) (Oral)   Ht 5' 4"  (1.626 m)   Wt 143 lb (64.9 kg)   SpO2 98%   BMI 24.55 kg/m   Assessment and Plan: 1. Acquired hypothyroidism Improving with  slow adjustment in medication dose Currently on 75 mcg daily plus 37.5 mcg MWF - TSH + free T4  2. Essential (primary) hypertension Clinically stable exam with well controlled BP on three agents. Tolerating medications without side effects at this time. Pt to continue current regimen and low sodium diet; benefits of regular exercise as able discussed.  3. Rheumatoid arthritis involving both hands with positive rheumatoid factor (HCC) Now being followed by Rheum Improving on current therapy  4. Idiopathic insomnia - zolpidem (AMBIEN CR) 12.5 MG CR tablet; Take 1 tablet (12.5 mg total) by mouth at bedtime as needed. for sleep  Dispense: 30 tablet; Refill: 5   Partially dictated using Editor, commissioning. Any errors are unintentional.  Halina Maidens, MD [facility]  06/11/2020

## 2020-06-17 LAB — TSH+FREE T4
Free T4: 1.6 ng/dL (ref 0.82–1.77)
TSH: 5.57 u[IU]/mL — ABNORMAL HIGH (ref 0.450–4.500)

## 2020-07-09 ENCOUNTER — Other Ambulatory Visit: Payer: Self-pay | Admitting: Internal Medicine

## 2020-07-09 DIAGNOSIS — E039 Hypothyroidism, unspecified: Secondary | ICD-10-CM

## 2020-07-09 NOTE — Telephone Encounter (Signed)
Requested Prescriptions  Pending Prescriptions Disp Refills  . levothyroxine (SYNTHROID) 75 MCG tablet [Pharmacy Med Name: LEVOTHYROXINE 75 MCG TABLET] 90 tablet 0    Sig: TAKE ONE TABLET BY MOUTH DAILY     Endocrinology:  Hypothyroid Agents Failed - 07/09/2020  8:47 AM      Failed - TSH needs to be rechecked within 3 months after an abnormal result. Refill until TSH is due.      Failed - TSH in normal range and within 360 days    TSH  Date Value Ref Range Status  06/16/2020 5.570 (H) 0.450 - 4.500 uIU/mL Final         Passed - Valid encounter within last 12 months    Recent Outpatient Visits          4 weeks ago Acquired hypothyroidism   [facility] Glean Hess, MD   3 months ago Essential (primary) hypertension   [facility] Glean Hess, MD   5 months ago Acquired hypothyroidism   [facility] Glean Hess, MD   7 months ago Acquired hypothyroidism   [facility] Glean Hess, MD   1 year ago Acquired hypothyroidism   [facility] Glean Hess, MD      Future Appointments            In 4 months [name] Jesse Sans, MD [facility], [place]   In 9 months [name] Jesse Sans, MD [facility], [place]

## 2020-07-28 ENCOUNTER — Encounter: Payer: Self-pay | Admitting: Internal Medicine

## 2020-07-28 ENCOUNTER — Other Ambulatory Visit: Payer: Self-pay

## 2020-07-28 MED ORDER — VALACYCLOVIR HCL 1 G PO TABS: 1000.0000 mg | ORAL_TABLET | Freq: Two times a day (BID) | ORAL | 0 refills | Status: DC

## 2020-08-12 ENCOUNTER — Other Ambulatory Visit: Payer: Self-pay | Admitting: Internal Medicine

## 2020-08-17 ENCOUNTER — Other Ambulatory Visit: Payer: Self-pay | Admitting: Internal Medicine

## 2020-08-17 DIAGNOSIS — I10 Essential (primary) hypertension: Secondary | ICD-10-CM

## 2020-09-20 ENCOUNTER — Other Ambulatory Visit: Payer: Self-pay | Admitting: Internal Medicine

## 2020-09-20 DIAGNOSIS — E039 Hypothyroidism, unspecified: Secondary | ICD-10-CM

## 2020-09-20 NOTE — Telephone Encounter (Signed)
Requested Prescriptions  Pending Prescriptions Disp Refills  . levothyroxine (SYNTHROID) 75 MCG tablet [Pharmacy Med Name: LEVOTHYROXINE 75 MCG TABLET] 90 tablet 0    Sig: TAKE ONE TABLET BY MOUTH DAILY     Endocrinology:  Hypothyroid Agents Failed - 09/20/2020  9:17 AM      Failed - TSH needs to be rechecked within 3 months after an abnormal result. Refill until TSH is due.      Failed - TSH in normal range and within 360 days    TSH  Date Value Ref Range Status  06/16/2020 5.570 (H) 0.450 - 4.500 uIU/mL Final         Passed - Valid encounter within last 12 months    Recent Outpatient Visits          3 months ago Acquired hypothyroidism   [place], [place]   5 months ago Essential (primary) hypertension   [facility] Glean Hess, MD   8 months ago Acquired hypothyroidism   [facility] Glean Hess, MD   10 months ago Acquired hypothyroidism   [facility] Glean Hess, MD   1 year ago Acquired hypothyroidism   [facility] Glean Hess, MD      Future Appointments            In 2 months [name] Jesse Sans, MD [facility], [place]   In 6 months [name] Jesse Sans, MD [facility], [facility]

## 2020-09-30 ENCOUNTER — Encounter: Payer: Self-pay | Admitting: Internal Medicine

## 2020-09-30 ENCOUNTER — Other Ambulatory Visit: Payer: Self-pay | Admitting: Internal Medicine

## 2020-10-05 ENCOUNTER — Other Ambulatory Visit: Payer: Self-pay | Admitting: Internal Medicine

## 2020-10-24 ENCOUNTER — Telehealth: Payer: Self-pay

## 2020-10-24 NOTE — Telephone Encounter (Signed)
LVMTCB to get AWV Visit scheduled and completed.

## 2020-11-12 ENCOUNTER — Other Ambulatory Visit: Payer: Self-pay | Admitting: Internal Medicine

## 2020-11-12 DIAGNOSIS — I10 Essential (primary) hypertension: Secondary | ICD-10-CM

## 2020-11-23 ENCOUNTER — Ambulatory Visit (INDEPENDENT_AMBULATORY_CARE_PROVIDER_SITE_OTHER): Payer: Medicare Other | Admitting: Internal Medicine

## 2020-11-23 ENCOUNTER — Encounter: Payer: Self-pay | Admitting: Internal Medicine

## 2020-11-23 ENCOUNTER — Other Ambulatory Visit: Payer: Self-pay

## 2020-11-23 VITALS — BP 134/76 | HR 55 | Temp 98.0°F | Ht 64.0 in | Wt 146.0 lb

## 2020-11-23 DIAGNOSIS — M8580 Other specified disorders of bone density and structure, unspecified site: Secondary | ICD-10-CM | POA: Diagnosis not present

## 2020-11-23 DIAGNOSIS — I10 Essential (primary) hypertension: Secondary | ICD-10-CM

## 2020-11-23 DIAGNOSIS — F5101 Primary insomnia: Secondary | ICD-10-CM

## 2020-11-23 DIAGNOSIS — E039 Hypothyroidism, unspecified: Secondary | ICD-10-CM

## 2020-11-23 MED ORDER — ZOLPIDEM TARTRATE ER 12.5 MG PO TBCR: 12.5000 mg | EXTENDED_RELEASE_TABLET | Freq: Every evening | ORAL | 5 refills | Status: DC | PRN

## 2020-11-23 NOTE — Progress Notes (Signed)
Date:  11/23/2020   Name:  Belinda Sanchez   DOB:  [birth year]   MRN:  [id number]   Chief Complaint: Hypothyroidism, Hypertension, and Flu Vaccine  Hypertension This is a chronic problem. The problem is controlled. Pertinent negatives include no chest pain, headaches, palpitations or shortness of breath. Past treatments include angiotensin blockers and beta blockers. Identifiable causes of hypertension include a thyroid problem.  Thyroid Problem Presents for follow-up visit. Patient reports no anxiety, constipation, diarrhea, fatigue or palpitations.   Lab Results  Component Value Date   CREATININE 0.70 04/09/2020   BUN 15 04/09/2020   NA 137 04/09/2020   K 4.3 04/09/2020   CL 97 04/09/2020   CO2 22 04/09/2020   Lab Results  Component Value Date   CHOL 251 (H) 04/09/2020   HDL 101 04/09/2020   LDLCALC 134 (H) 04/09/2020   TRIG 94 04/09/2020   CHOLHDL 2.5 04/09/2020   Lab Results  Component Value Date   TSH 5.570 (H) 06/16/2020   No results found for: HGBA1C Lab Results  Component Value Date   WBC 6.7 04/09/2020   HGB 14.6 04/09/2020   HCT 42.7 04/09/2020   MCV 93 04/09/2020   PLT 197 04/09/2020   Lab Results  Component Value Date   ALT 16 04/09/2020   AST 22 04/09/2020   ALKPHOS 130 (H) 04/09/2020   BILITOT 0.6 04/09/2020     Review of Systems  Constitutional:  Negative for chills, fatigue and unexpected weight change.  HENT:  Negative for nosebleeds.   Eyes:  Negative for visual disturbance.  Respiratory:  Negative for cough, chest tightness, shortness of breath and wheezing.   Cardiovascular:  Negative for chest pain, palpitations and leg swelling.  Gastrointestinal:  Negative for abdominal pain, constipation and diarrhea.  Musculoskeletal:  Positive for arthralgias and joint swelling.  Neurological:  Negative for dizziness, weakness, light-headedness and headaches.  Psychiatric/Behavioral:  Negative for dysphoric mood and sleep disturbance. The patient  is not nervous/anxious.    Patient Active Problem List   Diagnosis Date Noted  . Rheumatoid arthritis ([place]) 06/11/2020  . Hepatic steatosis 08/24/2018  . Elevated LFTs 08/21/2018  . Rotator cuff tendonitis, right 08/20/2018  . Arthritis of foot, left 12/20/2016  . 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 Herpes simplex infection 12/15/2014  . Acne erythematosa 12/03/2014  . Acquired hypothyroidism 12/03/2014  . Crohn's colitis, without complications ([place]) 12/11/2534  . Dyslipidemia 12/03/2014  . Essential (primary) hypertension 12/03/2014  . Idiopathic insomnia 12/03/2014  . Osteoarthritis of right knee 12/03/2014  . Avitaminosis D 12/03/2014  . History of breast cancer 06/27/2014    Allergies  Allergen Reactions  . Lozol  [Indapamide]     elevated serum calcium  . Loratadine Palpitations    Past Surgical History:  Procedure Laterality Date  . APPENDECTOMY    . BREAST EXCISIONAL BIOPSY Right 04/2014   benign  . COLONOSCOPY  08/2010   one polyp - repeat 5 yrs  . EYE SURGERY Right 04/02/2019   cataract   . MASTECTOMY, RADICAL Left 2009  . SHOULDER SURGERY  2016   AVN  . TOTAL SHOULDER REPLACEMENT Right 09/2019  . VAGINAL HYSTERECTOMY     total    Social History   Tobacco Use  . Smoking status: Never  . Smokeless tobacco: Never  Vaping Use  . Vaping Use: Never used  Substance Use Topics  . Alcohol use: Yes    Alcohol/week: 16.0 standard drinks    Types:  2 Standard drinks or equivalent, 14 Glasses of wine per week  . Drug use: No     Medication list has been reviewed and updated.  Current Meds  Medication Sig  . alendronate (FOSAMAX) 35 MG tablet Take 35 mg by mouth once a week.  . folic acid (FOLVITE) 1 MG tablet Take by mouth.  . hydroxychloroquine (PLAQUENIL) 200 MG tablet Take by mouth.  . levothyroxine (SYNTHROID) 75 MCG tablet TAKE ONE TABLET BY MOUTH DAILY  . losartan (COZAAR) 50 MG tablet TAKE ONE TABLET BY MOUTH DAILY  .  methotrexate (RHEUMATREX) 2.5 MG tablet Take by mouth once a week.  . nebivolol (BYSTOLIC) 10 MG tablet TAKE ONE TABLET BY MOUTH DAILY  . predniSONE (DELTASONE) 5 MG tablet Take by mouth daily.  Deborah Chalk ER 240 MG 24 hr capsule TAKE 1 CAPSULE (240 MG TOTAL) BY MOUTH DAILY.  . valACYclovir (VALTREX) 1000 MG tablet TAKE ONE TABLET BY MOUTH TWICE A DAY (Patient taking differently: as needed.)  . VITAMIN D PO Take by mouth.  . zolpidem (AMBIEN CR) 12.5 MG CR tablet Take 1 tablet (12.5 mg total) by mouth at bedtime as needed. for sleep    PHQ 2/9 Scores 11/23/2020 06/11/2020 04/09/2020 01/14/2020  PHQ - 2 Score 0 0 2 0  PHQ- 9 Score 3 0 4 0    GAD 7 : Generalized Anxiety Score 11/23/2020 06/11/2020 04/09/2020 01/14/2020  Nervous, Anxious, on Edge 0 1 0 0  Control/stop worrying 0 1 0 0  Worry too much - different things 0 1 0 0  Trouble relaxing 0 0 0 0  Restless 0 0 0 0  Easily annoyed or irritable 0 0 0 0  Afraid - awful might happen 0 0 0 0  Total GAD 7 Score 0 3 0 0  Anxiety Difficulty - - - -    BP Readings from Last 3 Encounters:  11/23/20 134/76  06/11/20 104/78  04/09/20 118/74    Physical Exam Vitals and nursing note reviewed.  Constitutional:      General: She is not in acute distress.    Appearance: Normal appearance. She is well-developed.  HENT:     Head: Normocephalic and atraumatic.  Cardiovascular:     Rate and Rhythm: Normal rate and regular rhythm.     Pulses: Normal pulses.     Heart sounds: No murmur heard. Pulmonary:     Effort: Pulmonary effort is normal. No respiratory distress.     Breath sounds: No wheezing or rhonchi.  Musculoskeletal:     Left shoulder: Tenderness present. Decreased range of motion.     Cervical back: Normal range of motion.     Right lower leg: No edema.     Left lower leg: No edema.  Skin:    General: Skin is warm and dry.     Capillary Refill: Capillary refill takes less than 2 seconds.     Findings: No rash.  Neurological:      General: No focal deficit present.     Mental Status: She is alert and oriented to person, place, and time.  Psychiatric:        Mood and Affect: Mood normal.        Behavior: Behavior normal.    Wt Readings from Last 3 Encounters:  11/23/20 146 lb (66.2 kg)  06/11/20 143 lb (64.9 kg)  04/09/20 140 lb (63.5 kg)    BP 134/76   Pulse (!) 55   Temp 98 F (36.7 C) (  Oral)   Ht 5' 4"  (1.626 m)   Wt 146 lb (66.2 kg)   SpO2 97%   BMI 25.06 kg/m   Assessment and Plan: 1. Essential (primary) hypertension Clinically stable exam with well controlled BP. Tolerating medications without side effects at this time. Pt to continue current regimen and low sodium diet; benefits of regular exercise as able discussed.  2. Acquired hypothyroidism Supplemented - on 75 mcg daily Weight has stabilized - TSH+T4F+T3Free  3. Idiopathic insomnia Doing well on Ambien without adverse side effects - zolpidem (AMBIEN CR) 12.5 MG CR tablet; Take 1 tablet (12.5 mg total) by mouth at bedtime as needed. for sleep  Dispense: 30 tablet; Refill: 5  4. Osteopenia, unspecified location Followed by [facility] on Fosamax and Calcium   Partially dictated using Dragon software. Any errors are unintentional.  Halina Maidens, MD [facility]  11/23/2020

## 2020-11-24 LAB — TSH+T4F+T3FREE
Free T4: 1.65 ng/dL (ref 0.82–1.77)
T3, Free: 1.8 pg/mL — ABNORMAL LOW (ref 2.0–4.4)
TSH: 2.1 u[IU]/mL (ref 0.450–4.500)

## 2020-11-26 ENCOUNTER — Ambulatory Visit: Payer: Medicare Other | Admitting: Internal Medicine

## 2020-12-06 ENCOUNTER — Other Ambulatory Visit: Payer: Self-pay | Admitting: Internal Medicine

## 2020-12-06 DIAGNOSIS — E039 Hypothyroidism, unspecified: Secondary | ICD-10-CM

## 2021-01-28 ENCOUNTER — Other Ambulatory Visit: Payer: Self-pay | Admitting: Internal Medicine

## 2021-01-29 NOTE — Telephone Encounter (Signed)
Requested Prescriptions  Pending Prescriptions Disp Refills   TIADYLT ER 240 MG 24 hr capsule [Pharmacy Med Name: TIADYLT ER 240 MG CAPSULE] 90 capsule 1    Sig: TAKE 1 CAPSULE BY MOUTH EVERY DAY     Cardiovascular:  Calcium Channel Blockers Passed - 01/28/2021  8:50 PM      Passed - Last BP in normal range    BP Readings from Last 1 Encounters:  11/23/20 134/76         Passed - Valid encounter within last 6 months    Recent Outpatient Visits          2 months ago Essential (primary) hypertension   [facility] Glean Hess, MD   7 months ago Acquired hypothyroidism   [facility] Glean Hess, MD   9 months ago Essential (primary) hypertension   [facility] Glean Hess, MD   1 year ago Acquired hypothyroidism   [facility] Glean Hess, MD   1 year ago Acquired hypothyroidism   [facility] Glean Hess, MD      Future Appointments            In 2 months [name] Jesse Sans, MD [facility], [facility]

## 2021-02-13 ENCOUNTER — Other Ambulatory Visit: Payer: Self-pay | Admitting: Internal Medicine

## 2021-02-13 DIAGNOSIS — I10 Essential (primary) hypertension: Secondary | ICD-10-CM

## 2021-02-13 NOTE — Telephone Encounter (Signed)
Requested Prescriptions  Pending Prescriptions Disp Refills   nebivolol (BYSTOLIC) 10 MG tablet [Pharmacy Med Name: NEBIVOLOL 10 MG TABLET] 30 tablet 2    Sig: TAKE ONE TABLET BY MOUTH DAILY     Cardiovascular:  Beta Blockers Passed - 02/13/2021  6:40 AM      Passed - Last BP in normal range    BP Readings from Last 1 Encounters:  11/23/20 134/76         Passed - Last Heart Rate in normal range    Pulse Readings from Last 1 Encounters:  11/23/20 (!) 16         Passed - Valid encounter within last 6 months    Recent Outpatient Visits          2 months ago Essential (primary) hypertension   [facility] Glean Hess, MD   8 months ago Acquired hypothyroidism   [place], [place]   10 months ago Essential (primary) hypertension   [facility] Glean Hess, MD   1 year ago Acquired hypothyroidism   [facility] Glean Hess, MD   1 year ago Acquired hypothyroidism   [facility] Glean Hess, MD      Future Appointments            In 1 month [name], Jesse Sans, MD [facility], [facility]

## 2021-04-12 ENCOUNTER — Encounter: Payer: Medicare Other | Admitting: Internal Medicine

## 2021-04-30 ENCOUNTER — Ambulatory Visit (INDEPENDENT_AMBULATORY_CARE_PROVIDER_SITE_OTHER): Payer: Medicare Other | Admitting: Internal Medicine

## 2021-04-30 ENCOUNTER — Other Ambulatory Visit: Payer: Self-pay

## 2021-04-30 ENCOUNTER — Encounter: Payer: Self-pay | Admitting: Internal Medicine

## 2021-04-30 VITALS — BP 118/78 | HR 59 | Ht 64.0 in | Wt 149.0 lb

## 2021-04-30 DIAGNOSIS — E039 Hypothyroidism, unspecified: Secondary | ICD-10-CM | POA: Diagnosis not present

## 2021-04-30 DIAGNOSIS — K5010 Crohn's disease of large intestine without complications: Secondary | ICD-10-CM | POA: Diagnosis not present

## 2021-04-30 DIAGNOSIS — M0579 Rheumatoid arthritis with rheumatoid factor of multiple sites without organ or systems involvement: Secondary | ICD-10-CM

## 2021-04-30 DIAGNOSIS — R739 Hyperglycemia, unspecified: Secondary | ICD-10-CM

## 2021-04-30 DIAGNOSIS — I10 Essential (primary) hypertension: Secondary | ICD-10-CM

## 2021-04-30 DIAGNOSIS — F5101 Primary insomnia: Secondary | ICD-10-CM

## 2021-04-30 DIAGNOSIS — E785 Hyperlipidemia, unspecified: Secondary | ICD-10-CM

## 2021-04-30 DIAGNOSIS — Z1159 Encounter for screening for other viral diseases: Secondary | ICD-10-CM

## 2021-04-30 MED ORDER — ZOLPIDEM TARTRATE ER 12.5 MG PO TBCR: 12.5000 mg | EXTENDED_RELEASE_TABLET | Freq: Every evening | ORAL | 5 refills | Status: DC | PRN

## 2021-04-30 MED ORDER — LOSARTAN POTASSIUM 50 MG PO TABS: 50.0000 mg | ORAL_TABLET | Freq: Every day | ORAL | 3 refills | Status: DC

## 2021-04-30 MED ORDER — NEBIVOLOL HCL 10 MG PO TABS: 10.0000 mg | ORAL_TABLET | Freq: Every day | ORAL | 3 refills | Status: DC

## 2021-04-30 NOTE — Progress Notes (Signed)
? ? ?Date:  04/30/2021  ? ?Name:  Belinda Sanchez   DOB:  04-13-1940   MRN:  [id number] ? ?Belinda Sanchez is a 81 y.o. female who presents today for her Complete Annual Exam. She feels well. She reports exercising walking daily. She reports she is sleeping well. Breast complaints none. ? ?Mammogram: 12/01/2020 ?DEXA: 07/21/2012 ?Colonoscopy: 12/14/2015  ? ?Immunization History  ?Administered Date(s) Administered  ? Fluad Quad(high Dose 65+) 11/19/2019  ? Influenza,inj,Quad PF,6+ Mos 12/15/2014, 10/26/2015, 10/16/2018  ? Influenza-Unspecified 11/22/2016  ? PFIZER Comirnaty(Gray Top)Covid-19 Tri-Sucrose Vaccine 03/21/2019, 04/11/2019  ? PFIZER(Purple Top)SARS-COV-2 Vaccination 02/26/2019, 03/21/2019, 04/11/2019  ? Pneumococcal Conjugate-13 12/23/2013  ? Pneumococcal Polysaccharide-23 02/16/2007, 12/10/2012  ? Rabies Immune Globulin 08/10/2016, 08/13/2016, 08/17/2016, 08/24/2016  ? Tdap 05/27/2013  ? Zoster Recombinat (Shingrix) 07/23/2016, 01/07/2017  ? Zoster, Live 02/15/2010  ?  ? ?Hypertension ?This is a chronic problem. The problem is controlled. Pertinent negatives include no chest pain, headaches, palpitations or shortness of breath. Past treatments include beta blockers, calcium channel blockers and angiotensin blockers. The current treatment provides significant improvement. There are no compliance problems.  Identifiable causes of hypertension include a thyroid problem.  ?Thyroid Problem ?Presents for follow-up visit. Patient reports no anxiety, constipation, diarrhea, fatigue, palpitations or tremors. The symptoms have been stable.  ?Crohns disease - on Remicade injections.  Symptoms are stable - some days with hard stools some days loose, no bleeding, no weight loss. ?RA - has daily joint aches but no active inflammation.  Recent sed rate 14.  On low dose prednisone, hydroxycholorquine, MTX. ?Lab Results  ?Component Value Date  ? NA 137 04/09/2020  ? K 4.3 04/09/2020  ? CO2 22 04/09/2020  ? GLUCOSE 93 04/09/2020   ? BUN 15 04/09/2020  ? CREATININE 0.70 04/09/2020  ? CALCIUM 9.9 04/09/2020  ? GFRNONAA 84 04/09/2020  ? ?Lab Results  ?Component Value Date  ? CHOL 251 (H) 04/09/2020  ? HDL 101 04/09/2020  ? LDLCALC 134 (H) 04/09/2020  ? TRIG 94 04/09/2020  ? CHOLHDL 2.5 04/09/2020  ? ?Lab Results  ?Component Value Date  ? TSH 2.100 11/23/2020  ? ?No results found for: HGBA1C ?Lab Results  ?Component Value Date  ? WBC 6.7 04/09/2020  ? HGB 14.6 04/09/2020  ? HCT 42.7 04/09/2020  ? MCV 93 04/09/2020  ? PLT 197 04/09/2020  ? ?Lab Results  ?Component Value Date  ? ALT 16 04/09/2020  ? AST 22 04/09/2020  ? ALKPHOS 130 (H) 04/09/2020  ? BILITOT 0.6 04/09/2020  ? ?Lab Results  ?Component Value Date  ? VD25OH 63.4 03/19/2019  ?  ? ?Review of Systems  ?Constitutional:  Negative for chills, fatigue, fever and unexpected weight change.  ?HENT:  Negative for congestion, hearing loss, tinnitus, trouble swallowing and voice change.   ?Eyes:  Negative for visual disturbance.  ?Respiratory:  Negative for cough, chest tightness, shortness of breath and wheezing.   ?Cardiovascular:  Negative for chest pain, palpitations and leg swelling.  ?Gastrointestinal:  Negative for abdominal pain, constipation, diarrhea and vomiting.  ?Endocrine: Negative for polydipsia and polyuria.  ?Genitourinary:  Negative for dysuria, frequency, genital sores, vaginal bleeding and vaginal discharge.  ?Musculoskeletal:  Positive for arthralgias and myalgias. Negative for gait problem and joint swelling.  ?Skin:  Positive for color change (brusing and skin tears easily). Negative for rash.  ?Neurological:  Negative for dizziness, tremors, light-headedness and headaches.  ?Hematological:  Negative for adenopathy.  ?Psychiatric/Behavioral:  Negative for dysphoric mood and sleep disturbance. The patient is not  nervous/anxious.   ? ?Patient Active Problem List  ? Diagnosis Date Noted  ? Osteopenia 11/23/2020  ? Rheumatoid arthritis ([place]) 06/11/2020  ? Hepatic steatosis  08/24/2018  ? Elevated LFTs 08/21/2018  ? Rotator cuff tendonitis, right 08/20/2018  ? Arthritis of foot, left 12/20/2016  ? Inflammatory spondylopathy of lumbar region [facility]) 08/08/2015  ? Herpes simplex infection 12/15/2014  ? Acne erythematosa 12/03/2014  ? Acquired hypothyroidism 12/03/2014  ? Crohn's colitis, without complications ([place]) 44/92/0100  ? Dyslipidemia 12/03/2014  ? Essential (primary) hypertension 12/03/2014  ? Idiopathic insomnia 12/03/2014  ? Osteoarthritis of right knee 12/03/2014  ? Avitaminosis D 12/03/2014  ? History of breast cancer 06/27/2014  ? ? ?Allergies  ?Allergen Reactions  ? Lozol  [Indapamide]   ?  elevated serum calcium  ? Loratadine Palpitations  ? ? ?Past Surgical History:  ?Procedure Laterality Date  ? APPENDECTOMY    ? BREAST EXCISIONAL BIOPSY Right 04/2014  ? benign  ? COLONOSCOPY  08/2010  ? one polyp - repeat 5 yrs  ? EYE SURGERY Right 04/02/2019  ? cataract   ? MASTECTOMY, RADICAL Left 2009  ? SHOULDER SURGERY  2016  ? AVN  ? TOTAL SHOULDER REPLACEMENT Right 09/2019  ? VAGINAL HYSTERECTOMY    ? total  ? ? ?Social History  ? ?Tobacco Use  ? Smoking status: Never  ? Smokeless tobacco: Never  ?Vaping Use  ? Vaping Use: Never used  ?Substance Use Topics  ? Alcohol use: Yes  ?  Alcohol/week: 16.0 standard drinks  ?  Types: 2 Standard drinks or equivalent, 14 Glasses of wine per week  ? Drug use: No  ? ? ? ?Medication list has been reviewed and updated. ? ?Current Meds  ?Medication Sig  ? alendronate (FOSAMAX) 35 MG tablet Take 35 mg by mouth once a week.  ? folic acid (FOLVITE) 1 MG tablet Take by mouth.  ? hydroxychloroquine (PLAQUENIL) 200 MG tablet Take by mouth.  ? inFLIXimab (REMICADE) 100 MG injection Inject into the vein.  ? levothyroxine (SYNTHROID) 75 MCG tablet TAKE ONE TABLET BY MOUTH DAILY  ? methotrexate (RHEUMATREX) 2.5 MG tablet Take by mouth once a week. 4 tablets once a week  ? predniSONE (DELTASONE) 5 MG tablet Take 2.5 mg by mouth daily.  ? TIADYLT ER 240 MG 24 hr  capsule TAKE 1 CAPSULE BY MOUTH EVERY DAY  ? valACYclovir (VALTREX) 1000 MG tablet TAKE ONE TABLET BY MOUTH TWICE A DAY (Patient taking differently: as needed.)  ? VITAMIN D PO Take by mouth.  ? [DISCONTINUED] losartan (COZAAR) 50 MG tablet TAKE ONE TABLET BY MOUTH DAILY  ? [DISCONTINUED] nebivolol (BYSTOLIC) 10 MG tablet TAKE ONE TABLET BY MOUTH DAILY  ? [DISCONTINUED] zolpidem (AMBIEN CR) 12.5 MG CR tablet Take 1 tablet (12.5 mg total) by mouth at bedtime as needed. for sleep  ? ? ?PHQ 2/9 Scores 04/30/2021 11/23/2020 06/11/2020 04/09/2020  ?PHQ - 2 Score 0 0 0 2  ?PHQ- 9 Score 4 3 0 4  ? ? ?GAD 7 : Generalized Anxiety Score 04/30/2021 11/23/2020 06/11/2020 04/09/2020  ?Nervous, Anxious, on Edge 0 0 1 0  ?Control/stop worrying 0 0 1 0  ?Worry too much - different things 1 0 1 0  ?Trouble relaxing 0 0 0 0  ?Restless 0 0 0 0  ?Easily annoyed or irritable 0 0 0 0  ?Afraid - awful might happen 0 0 0 0  ?Total GAD 7 Score 1 0 3 0  ?Anxiety Difficulty - - - -  ? ? ?  BP Readings from Last 3 Encounters:  ?04/30/21 118/78  ?11/23/20 134/76  ?06/11/20 104/78  ? ? ?Physical Exam ?Vitals and nursing note reviewed.  ?Constitutional:   ?   General: She is not in acute distress. ?   Appearance: She is well-developed.  ?HENT:  ?   Head: Normocephalic and atraumatic.  ?   Right Ear: Tympanic membrane and ear canal normal.  ?   Left Ear: Tympanic membrane and ear canal normal.  ?   Nose:  ?   Right Sinus: No maxillary sinus tenderness.  ?   Left Sinus: No maxillary sinus tenderness.  ?Eyes:  ?   General: No scleral icterus.    ?   Right eye: No discharge.     ?   Left eye: No discharge.  ?   Conjunctiva/sclera: Conjunctivae normal.  ?Neck:  ?   Thyroid: No thyromegaly.  ?   Vascular: No carotid bruit.  ?Cardiovascular:  ?   Rate and Rhythm: Normal rate and regular rhythm.  ?   Pulses: Normal pulses.  ?   Heart sounds: Normal heart sounds.  ?Pulmonary:  ?   Effort: Pulmonary effort is normal. No respiratory distress.  ?   Breath sounds: No  wheezing.  ?Chest:  ?   Comments: deferred ?Abdominal:  ?   General: Bowel sounds are normal.  ?   Palpations: Abdomen is soft.  ?   Tenderness: There is no abdominal tenderness.  ?Musculoskeletal:  ?   C

## 2021-05-01 LAB — HEMOGLOBIN A1C
Est. average glucose Bld gHb Est-mCnc: 105 mg/dL
Hgb A1c MFr Bld: 5.3 % (ref 4.8–5.6)

## 2021-05-01 LAB — CBC WITH DIFFERENTIAL/PLATELET
Basophils Absolute: 0.1 10*3/uL (ref 0.0–0.2)
Basos: 1 %
EOS (ABSOLUTE): 0.1 10*3/uL (ref 0.0–0.4)
Eos: 1 %
Hematocrit: 44.3 % (ref 34.0–46.6)
Hemoglobin: 15.1 g/dL (ref 11.1–15.9)
Immature Grans (Abs): 0.1 10*3/uL (ref 0.0–0.1)
Immature Granulocytes: 1 %
Lymphocytes Absolute: 2 10*3/uL (ref 0.7–3.1)
Lymphs: 20 %
MCH: 32.9 pg (ref 26.6–33.0)
MCHC: 34.1 g/dL (ref 31.5–35.7)
MCV: 97 fL (ref 79–97)
Monocytes Absolute: 0.8 10*3/uL (ref 0.1–0.9)
Monocytes: 8 %
Neutrophils Absolute: 7 10*3/uL (ref 1.4–7.0)
Neutrophils: 69 %
Platelets: 196 10*3/uL (ref 150–450)
RBC: 4.59 x10E6/uL (ref 3.77–5.28)
RDW: 11.9 % (ref 11.7–15.4)
WBC: 10.1 10*3/uL (ref 3.4–10.8)

## 2021-05-01 LAB — COMPREHENSIVE METABOLIC PANEL
ALT: 27 IU/L (ref 0–32)
AST: 26 IU/L (ref 0–40)
Albumin/Globulin Ratio: 1.9 (ref 1.2–2.2)
Albumin: 4.5 g/dL (ref 3.7–4.7)
Alkaline Phosphatase: 74 IU/L (ref 44–121)
BUN/Creatinine Ratio: 23 (ref 12–28)
BUN: 16 mg/dL (ref 8–27)
Bilirubin Total: 0.7 mg/dL (ref 0.0–1.2)
CO2: 26 mmol/L (ref 20–29)
Calcium: 9.9 mg/dL (ref 8.7–10.3)
Chloride: 98 mmol/L (ref 96–106)
Creatinine, Ser: 0.69 mg/dL (ref 0.57–1.00)
Globulin, Total: 2.4 g/dL (ref 1.5–4.5)
Glucose: 92 mg/dL (ref 70–99)
Potassium: 4.1 mmol/L (ref 3.5–5.2)
Sodium: 141 mmol/L (ref 134–144)
Total Protein: 6.9 g/dL (ref 6.0–8.5)
eGFR: 89 mL/min/{1.73_m2} (ref >59)

## 2021-05-01 LAB — TSH+FREE T4
Free T4: 1.62 ng/dL (ref 0.82–1.77)
TSH: 7.51 u[IU]/mL — ABNORMAL HIGH (ref 0.450–4.500)

## 2021-05-01 LAB — LIPID PANEL
Chol/HDL Ratio: 2.4 ratio (ref 0.0–4.4)
Cholesterol, Total: 300 mg/dL — ABNORMAL HIGH (ref 100–199)
HDL: 123 mg/dL (ref >39)
LDL Chol Calc (NIH): 153 mg/dL — ABNORMAL HIGH (ref 0–99)
Triglycerides: 143 mg/dL (ref 0–149)
VLDL Cholesterol Cal: 24 mg/dL (ref 5–40)

## 2021-05-01 LAB — HEPATITIS C ANTIBODY: Hep C Virus Ab: NONREACTIVE

## 2021-05-03 ENCOUNTER — Other Ambulatory Visit: Payer: Self-pay | Admitting: Internal Medicine

## 2021-05-03 DIAGNOSIS — I10 Essential (primary) hypertension: Secondary | ICD-10-CM

## 2021-05-04 NOTE — Telephone Encounter (Signed)
Belinda Sanchez called and spoke to [place], [facility] about the refill(s) Losartan requested. Advised it was sent on 04/30/21 #90/3 refill(s). She says they do have it and this request was sent in error. Will refuse this request. ? ? ? ?Requested Prescriptions  ?Pending Prescriptions Disp Refills  ? losartan (COZAAR) 50 MG tablet [Pharmacy Med Name: LOSARTAN POTASSIUM 50 MG TAB] 90 tablet 3  ?  Sig: TAKE ONE TABLET BY MOUTH DAILY  ?  ? Cardiovascular:  Angiotensin Receptor Blockers Passed - 05/03/2021  6:10 AM  ?  ?  Passed - Cr in normal range and within 180 days  ?  Creatinine, Ser  ?Date Value Ref Range Status  ?04/30/2021 0.69 0.57 - 1.00 mg/dL Final  ?  ?  ?  ?  Passed - K in normal range and within 180 days  ?  Potassium  ?Date Value Ref Range Status  ?04/30/2021 4.1 3.5 - 5.2 mmol/L Final  ?  ?  ?  ?  Passed - Patient is not pregnant  ?  ?  Passed - Last BP in normal range  ?  BP Readings from Last 1 Encounters:  ?04/30/21 118/78  ?  ?  ?  ?  Passed - Valid encounter within last 6 months  ?  Recent Outpatient Visits   ? ?      ? 4 days ago Essential (primary) hypertension  ? [facility] Glean Hess, MD  ? 5 months ago Essential (primary) hypertension  ? [facility] Glean Hess, MD  ? 10 months ago Acquired hypothyroidism  ? [facility] Glean Hess, MD  ? 1 year ago Essential (primary) hypertension  ? [facility] Glean Hess, MD  ? 1 year ago Acquired hypothyroidism  ? [facility] Glean Hess, MD  ? ?  ?  ?Future Appointments   ? ?        ? In 1 year [name], Jesse Sans, MD [facility], [place]  ? ?  ? ?  ?  ?  ? ? ? ?

## 2021-05-06 ENCOUNTER — Other Ambulatory Visit: Payer: Self-pay | Admitting: Internal Medicine

## 2021-05-06 DIAGNOSIS — E039 Hypothyroidism, unspecified: Secondary | ICD-10-CM

## 2021-05-08 NOTE — Telephone Encounter (Signed)
Requested medications are due for refill today.  yes ? ?Requested medications are on the active medications list.  yes ? ?Last refill. 12/07/2020 #90 1 refill ? ?Future visit scheduled.   yes ? ?Notes to clinic.  Failed protocol d/t abnormal labs. ? ? ? ?Requested Prescriptions  ?Pending Prescriptions Disp Refills  ? levothyroxine (SYNTHROID) 75 MCG tablet [Pharmacy Med Name: LEVOTHYROXINE 75 MCG TABLET] 90 tablet 1  ?  Sig: TAKE ONE TABLET BY MOUTH DAILY  ?  ? Endocrinology:  Hypothyroid Agents Failed - 05/06/2021 10:26 AM  ?  ?  Failed - TSH in normal range and within 360 days  ?  TSH  ?Date Value Ref Range Status  ?04/30/2021 7.510 (H) 0.450 - 4.500 uIU/mL Final  ?  ?  ?  ?  Passed - Valid encounter within last 12 months  ?  Recent Outpatient Visits   ? ?      ? 1 week ago Essential (primary) hypertension  ? [facility] Glean Hess, MD  ? 5 months ago Essential (primary) hypertension  ? [facility] Glean Hess, MD  ? 11 months ago Acquired hypothyroidism  ? [facility] Glean Hess, MD  ? 1 year ago Essential (primary) hypertension  ? [facility] Glean Hess, MD  ? 1 year ago Acquired hypothyroidism  ? [facility] Glean Hess, MD  ? ?  ?  ?Future Appointments   ? ?        ? In 12 months Glean Hess, MD [facility], [place]  ? ?  ? ?  ?  ?  ?  ?

## 2021-05-09 ENCOUNTER — Encounter: Payer: Self-pay | Admitting: Internal Medicine

## 2021-05-09 ENCOUNTER — Other Ambulatory Visit: Payer: Self-pay | Admitting: Internal Medicine

## 2021-05-09 DIAGNOSIS — E039 Hypothyroidism, unspecified: Secondary | ICD-10-CM

## 2021-05-09 MED ORDER — LEVOTHYROXINE SODIUM 75 MCG PO TABS: 75.0000 ug | ORAL_TABLET | Freq: Every day | ORAL | 1 refills | Status: DC

## 2021-07-01 ENCOUNTER — Encounter: Payer: Self-pay | Admitting: Internal Medicine

## 2021-07-06 ENCOUNTER — Ambulatory Visit
Admission: RE | Admit: 2021-07-06 | Discharge: 2021-07-06 | Disposition: A | Discharge status: Home / Self Care | Payer: Medicare Other | Source: Ambulatory Visit | Attending: Internal Medicine | Admitting: Internal Medicine

## 2021-07-06 ENCOUNTER — Ambulatory Visit (INDEPENDENT_AMBULATORY_CARE_PROVIDER_SITE_OTHER): Payer: Medicare Other | Admitting: Internal Medicine

## 2021-07-06 ENCOUNTER — Encounter: Payer: Self-pay | Admitting: Internal Medicine

## 2021-07-06 ENCOUNTER — Telehealth: Payer: Self-pay

## 2021-07-06 ENCOUNTER — Ambulatory Visit
Admission: RE | Admit: 2021-07-06 | Discharge: 2021-07-06 | Disposition: A | Discharge status: Home / Self Care | Payer: Medicare Other | Attending: Internal Medicine | Admitting: Internal Medicine

## 2021-07-06 VITALS — BP 128/84 | HR 55 | Ht 64.0 in | Wt 148.0 lb

## 2021-07-06 DIAGNOSIS — M545 Low back pain, unspecified: Secondary | ICD-10-CM | POA: Insufficient documentation

## 2021-07-06 MED ORDER — METHOCARBAMOL 500 MG PO TABS: 500.0000 mg | ORAL_TABLET | Freq: Four times a day (QID) | ORAL | 0 refills | Status: DC

## 2021-07-06 MED ORDER — HYDROCODONE-ACETAMINOPHEN 5-325 MG PO TABS: 1.0000 | ORAL_TABLET | Freq: Four times a day (QID) | ORAL | 0 refills | Status: DC | PRN

## 2021-07-06 NOTE — Telephone Encounter (Signed)
Received call from [facility] at [facility] radiology regarding results.  Study Result  Narrative & Impression  CLINICAL DATA:  [place] last week   EXAM: LUMBAR SPINE - COMPLETE 4+ VIEW   COMPARISON:  None Available.   FINDINGS: Mild scoliosis. At least moderate acute to subacute appearing fracture involving the superior endplate at L1, also involves the anterior aspect of vertebral body. Estimated 40% loss of vertebral body height. Age indeterminate mild superior endplate deformity at L2. Remaining vertebra demonstrate normal stature. Moderate disc space narrowing and degenerative change at L3-L4 and L4-L5. Facet degenerative changes at multiple levels. Aortic atherosclerosis   IMPRESSION: 1. Moderate acute to subacute appearing fracture at L1 involving superior endplate and anterior aspect of vertebral body with estimated 40% loss of vertebral body height 2. Mild age indeterminate superior endplate deformity at L2 3. Multilevel degenerative change   These results will be called to the ordering clinician or representative by the Radiologist Assistant, and communication documented in the PACS or [employer].     Electronically Signed   By: Donavan Foil M.D.   On: 07/06/2021 17:00

## 2021-07-06 NOTE — Progress Notes (Signed)
Date:  07/06/2021   Name:  Belinda Sanchez   DOB:  September 13, 1940   MRN:  [id number]   Chief Complaint: Back Pain  HPI Back Pain This is a new problem. Episode onset: 1 week. The problem has been gradually worsening since onset. The quality of the pain is described as aching. The pain does not radiate. The pain is at a severity of 10/10. The pain is moderate. The symptoms are aggravated by bending, sitting and twisting. She has tried nothing for the symptoms.   Lab Results  Component Value Date   NA 141 04/30/2021   K 4.1 04/30/2021   CO2 26 04/30/2021   GLUCOSE 92 04/30/2021   BUN 16 04/30/2021   CREATININE 0.69 04/30/2021   CALCIUM 9.9 04/30/2021   EGFR 89 04/30/2021   GFRNONAA 84 04/09/2020   Lab Results  Component Value Date   CHOL 300 (H) 04/30/2021   HDL 123 04/30/2021   LDLCALC 153 (H) 04/30/2021   TRIG 143 04/30/2021   CHOLHDL 2.4 04/30/2021   Lab Results  Component Value Date   TSH 7.510 (H) 04/30/2021   Lab Results  Component Value Date   HGBA1C 5.3 04/30/2021   Lab Results  Component Value Date   WBC 10.1 04/30/2021   HGB 15.1 04/30/2021   HCT 44.3 04/30/2021   MCV 97 04/30/2021   PLT 196 04/30/2021   Lab Results  Component Value Date   ALT 27 04/30/2021   AST 26 04/30/2021   ALKPHOS 74 04/30/2021   BILITOT 0.7 04/30/2021   Lab Results  Component Value Date   VD25OH 63.4 03/19/2019     Review of Systems  Constitutional:  Negative for chills and fatigue.  Respiratory:  Negative for chest tightness and shortness of breath.   Cardiovascular:  Negative for chest pain and leg swelling.  Genitourinary:  Positive for urgency. Negative for difficulty urinating.  Musculoskeletal:  Positive for back pain and gait problem.   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   Acne erythematosa 12/03/2014   Acquired hypothyroidism 12/03/2014   Crohn's colitis, without complications ([place]) 28/20/8138   Dyslipidemia 12/03/2014   Essential (primary) hypertension 12/03/2014   Idiopathic insomnia 12/03/2014   Osteoarthritis of right knee 12/03/2014   Avitaminosis D 12/03/2014   History of breast cancer 06/27/2014    Allergies  Allergen Reactions   Lozol  [Indapamide]     elevated serum calcium   Loratadine Palpitations    Past Surgical History:  Procedure Laterality Date   APPENDECTOMY     BREAST EXCISIONAL BIOPSY Right 04/2014   benign   COLONOSCOPY  08/2010   one polyp - repeat 5 yrs   EYE SURGERY Right 04/02/2019   cataract    MASTECTOMY, RADICAL Left 2009   SHOULDER SURGERY  2016   AVN   TOTAL SHOULDER REPLACEMENT Right 09/2019   VAGINAL HYSTERECTOMY     total    Social History   Tobacco Use   Smoking status: Never   Smokeless tobacco: Never  Vaping Use   Vaping Use: Never used  Substance Use Topics   Alcohol use: Yes    Alcohol/week: 16.0 standard drinks    Types: 2  Standard drinks or equivalent, 14 Glasses of wine per week   Drug use: No     Medication list has been reviewed and updated.  Current Meds  Medication Sig   alendronate (FOSAMAX) 35 MG tablet Take 35 mg by mouth once a week.   folic acid (FOLVITE) 1 MG tablet Take by mouth.   hydroxychloroquine (PLAQUENIL) 200 MG tablet Take by mouth.   inFLIXimab (REMICADE) 100 MG injection Inject into the vein.   levothyroxine (SYNTHROID) 75 MCG tablet Take 1 tablet (75 mcg total) by mouth daily.   losartan (COZAAR) 50 MG tablet Take 1 tablet (50 mg total) by mouth daily.   methocarbamol (ROBAXIN) 500 MG tablet Take 1 tablet (500 mg total) by mouth 4 (four) times daily.   methotrexate (RHEUMATREX) 2.5 MG tablet Take by mouth once a week. 4  tablets once a week   nebivolol (BYSTOLIC) 10 MG tablet Take 1 tablet (10 mg total) by mouth daily.   predniSONE (DELTASONE) 5 MG tablet Take 2.5 mg by mouth daily.   TIADYLT ER 240 MG 24 hr capsule TAKE 1 CAPSULE BY MOUTH EVERY DAY   valACYclovir (VALTREX) 1000 MG tablet TAKE ONE TABLET BY MOUTH TWICE A DAY (Patient taking differently: as needed.)   VITAMIN D PO Take by mouth.   zolpidem (AMBIEN CR) 12.5 MG CR tablet Take 1 tablet (12.5 mg total) by mouth at bedtime as needed. for sleep       07/06/2021    8:11 AM 04/30/2021   10:47 AM 11/23/2020    1:39 PM 06/11/2020   10:31 AM  GAD 7 : Generalized Anxiety Score  Nervous, Anxious, on Edge 1 0 0 1  Control/stop worrying 2 0 0 1  Worry too much - different things 2 1 0 1  Trouble relaxing 2 0 0 0  Restless 1 0 0 0  Easily annoyed or irritable 1 0 0 0  Afraid - awful might happen 2 0 0 0  Total GAD 7 Score 11 1 0 3  Anxiety Difficulty Very difficult          07/06/2021    8:11 AM  Depression screen PHQ 2/9  Decreased Interest 1  Down, Depressed, Hopeless 1  PHQ - 2 Score 2  Altered sleeping 3  Tired, decreased energy 3  Change in appetite 3  Feeling bad or failure about yourself  1  Trouble concentrating 1  Moving slowly or fidgety/restless 1  Suicidal thoughts 1  PHQ-9 Score 15  Difficult doing work/chores Very difficult    BP Readings from Last 3 Encounters:  07/06/21 128/84  04/30/21 118/78  11/23/20 134/76    Physical Exam Vitals and nursing note reviewed.  Constitutional:      General: She is in acute distress.     Appearance: She is well-developed.  HENT:     Head: Normocephalic and atraumatic.  Cardiovascular:     Rate and Rhythm: Normal rate and regular rhythm.  Pulmonary:     Effort: Pulmonary effort is normal. No respiratory distress.     Breath sounds: No wheezing or rhonchi.  Musculoskeletal:     Lumbar back: Tenderness present. No bony tenderness. Decreased range of motion. Positive right  straight leg raise test and positive left straight leg raise test.  Skin:    General: Skin is warm and dry.     Findings: No rash.  Neurological:     Mental Status: She is alert and oriented to person, place, and time.  Psychiatric:        Mood and Affect: Mood normal.        Behavior: Behavior normal.    Wt Readings from Last 3 Encounters:  07/06/21 148 lb (67.1 kg)  04/30/21 149 lb (67.6 kg)  11/23/20 146 lb (66.2 kg)    BP 128/84   Pulse (!) 55   Ht 5' 4"  (1.626 m)   Wt 148 lb (67.1 kg)   SpO2 96%   BMI 25.40 kg/m   Assessment and Plan: 1. Acute midline low back pain without sciatica S/p fall at home. Concern for lumbar compression fx/pelvic fx vs soft tissue contusion Will treat with pain medications, muscle relaxants, ice/heat - DG Lumbar Spine Complete - methocarbamol (ROBAXIN) 500 MG tablet; Take 1 tablet (500 mg total) by mouth 4 (four) times daily.  Dispense: 60 tablet; Refill: 0 - HYDROcodone-acetaminophen (NORCO/VICODIN) 5-325 MG tablet; Take 1 tablet by mouth every 6 (six) hours as needed.  Dispense: 30 tablet; Refill: 0   Partially dictated using Editor, commissioning. Any errors are unintentional.  Halina Maidens, MD [facility]  07/06/2021

## 2021-07-06 NOTE — Progress Notes (Deleted)
Date:  07/06/2021   Name:  Belinda Sanchez   DOB:  1940/06/22   MRN:  [id number]   Chief Complaint: Back Pain  Back Pain This is a new problem. Episode onset: 1 week. The problem has been gradually worsening since onset. The quality of the pain is described as aching. The pain does not radiate. The pain is at a severity of 10/10. The pain is moderate. The symptoms are aggravated by bending, sitting and twisting. She has tried nothing for the symptoms.   Lab Results  Component Value Date   NA 141 04/30/2021   K 4.1 04/30/2021   CO2 26 04/30/2021   GLUCOSE 92 04/30/2021   BUN 16 04/30/2021   CREATININE 0.69 04/30/2021   CALCIUM 9.9 04/30/2021   EGFR 89 04/30/2021   GFRNONAA 84 04/09/2020   Lab Results  Component Value Date   CHOL 300 (H) 04/30/2021   HDL 123 04/30/2021   LDLCALC 153 (H) 04/30/2021   TRIG 143 04/30/2021   CHOLHDL 2.4 04/30/2021   Lab Results  Component Value Date   TSH 7.510 (H) 04/30/2021   Lab Results  Component Value Date   HGBA1C 5.3 04/30/2021   Lab Results  Component Value Date   WBC 10.1 04/30/2021   HGB 15.1 04/30/2021   HCT 44.3 04/30/2021   MCV 97 04/30/2021   PLT 196 04/30/2021   Lab Results  Component Value Date   ALT 27 04/30/2021   AST 26 04/30/2021   ALKPHOS 74 04/30/2021   BILITOT 0.7 04/30/2021   Lab Results  Component Value Date   VD25OH 63.4 03/19/2019     Review of Systems  Musculoskeletal:  Positive for back pain.   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/08/2015   Herpes simplex infection 12/15/2014   Acne erythematosa 12/03/2014   Acquired hypothyroidism 12/03/2014   Crohn's colitis, without complications ([place]) 41/74/0814   Dyslipidemia  12/03/2014   Essential (primary) hypertension 12/03/2014   Idiopathic insomnia 12/03/2014   Osteoarthritis of right knee 12/03/2014   Avitaminosis D 12/03/2014   History of breast cancer 06/27/2014    Allergies  Allergen Reactions   Lozol  [Indapamide]     elevated serum calcium   Loratadine Palpitations    Past Surgical History:  Procedure Laterality Date   APPENDECTOMY     BREAST EXCISIONAL BIOPSY Right 04/2014   benign   COLONOSCOPY  08/2010   one polyp - repeat 5 yrs   EYE SURGERY Right 04/02/2019   cataract    MASTECTOMY, RADICAL Left 2009   SHOULDER SURGERY  2016   AVN   TOTAL SHOULDER REPLACEMENT Right 09/2019   VAGINAL HYSTERECTOMY     total    Social History   Tobacco Use   Smoking status: Never   Smokeless tobacco: Never  Vaping Use   Vaping Use: Never used  Substance Use Topics   Alcohol use: Yes    Alcohol/week: 16.0 standard drinks    Types: 2 Standard drinks or equivalent, 14 Glasses of wine per week   Drug use: No     Medication list has been reviewed and updated.  Current Meds  Medication Sig   alendronate (FOSAMAX) 35 MG tablet Take 35 mg by mouth  once a week.   folic acid (FOLVITE) 1 MG tablet Take by mouth.   hydroxychloroquine (PLAQUENIL) 200 MG tablet Take by mouth.   inFLIXimab (REMICADE) 100 MG injection Inject into the vein.   levothyroxine (SYNTHROID) 75 MCG tablet Take 1 tablet (75 mcg total) by mouth daily.   losartan (COZAAR) 50 MG tablet Take 1 tablet (50 mg total) by mouth daily.   methotrexate (RHEUMATREX) 2.5 MG tablet Take by mouth once a week. 4 tablets once a week   nebivolol (BYSTOLIC) 10 MG tablet Take 1 tablet (10 mg total) by mouth daily.   predniSONE (DELTASONE) 5 MG tablet Take 2.5 mg by mouth daily.   TIADYLT ER 240 MG 24 hr capsule TAKE 1 CAPSULE BY MOUTH EVERY DAY   valACYclovir (VALTREX) 1000 MG tablet TAKE ONE TABLET BY MOUTH TWICE A DAY (Patient taking differently: as needed.)   VITAMIN D PO Take by mouth.    zolpidem (AMBIEN CR) 12.5 MG CR tablet Take 1 tablet (12.5 mg total) by mouth at bedtime as needed. for sleep       04/30/2021   10:47 AM 11/23/2020    1:39 PM 06/11/2020   10:31 AM 04/09/2020   10:18 AM  GAD 7 : Generalized Anxiety Score  Nervous, Anxious, on Edge 0 0 1 0  Control/stop worrying 0 0 1 0  Worry too much - different things 1 0 1 0  Trouble relaxing 0 0 0 0  Restless 0 0 0 0  Easily annoyed or irritable 0 0 0 0  Afraid - awful might happen 0 0 0 0  Total GAD 7 Score 1 0 3 0       04/30/2021   10:47 AM  Depression screen PHQ 2/9  Decreased Interest 0  Down, Depressed, Hopeless 0  PHQ - 2 Score 0  Altered sleeping 3  Tired, decreased energy 1  Change in appetite 0  Feeling bad or failure about yourself  0  Trouble concentrating 0  Moving slowly or fidgety/restless 0  Suicidal thoughts 0  PHQ-9 Score 4  Difficult doing work/chores Not difficult at all    BP Readings from Last 3 Encounters:  04/30/21 118/78  11/23/20 134/76  06/11/20 104/78    Physical Exam  Wt Readings from Last 3 Encounters:  07/06/21 148 lb (67.1 kg)  04/30/21 149 lb (67.6 kg)  11/23/20 146 lb (66.2 kg)    Ht 5' 4"  (1.626 m)   Wt 148 lb (67.1 kg)   BMI 25.40 kg/m   Assessment and Plan:

## 2021-07-07 NOTE — Telephone Encounter (Signed)
Patient discussed this with Dr [name] over the phone yesterday afternoon.

## 2021-07-09 ENCOUNTER — Encounter: Payer: Self-pay | Admitting: Internal Medicine

## 2021-07-16 ENCOUNTER — Encounter: Payer: Self-pay | Admitting: Internal Medicine

## 2021-07-24 ENCOUNTER — Other Ambulatory Visit: Payer: Self-pay | Admitting: Internal Medicine

## 2021-07-26 NOTE — Telephone Encounter (Signed)
Requested Prescriptions  Pending Prescriptions Disp Refills  . TIADYLT ER 240 MG 24 hr capsule [Pharmacy Med Name: TIADYLT ER 240 MG CAPSULE] 90 capsule 1    Sig: TAKE 1 CAPSULE BY MOUTH EVERY DAY     Cardiovascular: Calcium Channel Blockers 3 Passed - 07/24/2021 10:30 AM      Passed - ALT in normal range and within 360 days    ALT  Date Value Ref Range Status  04/30/2021 27 0 - 32 IU/L Final         Passed - AST in normal range and within 360 days    AST  Date Value Ref Range Status  04/30/2021 26 0 - 40 IU/L Final         Passed - Cr in normal range and within 360 days    Creatinine, Ser  Date Value Ref Range Status  04/30/2021 0.69 0.57 - 1.00 mg/dL Final         Passed - Last BP in normal range    BP Readings from Last 1 Encounters:  07/06/21 128/84         Passed - Last Heart Rate in normal range    Pulse Readings from Last 1 Encounters:  07/06/21 (!) 55         Passed - Valid encounter within last 6 months    Recent Outpatient Visits          2 weeks ago Acute midline low back pain without sciatica   [facility] Glean Hess, MD   2 months ago Essential (primary) hypertension   [facility] Glean Hess, MD   8 months ago Essential (primary) hypertension   [facility] Glean Hess, MD   1 year ago Acquired hypothyroidism   [facility] Glean Hess, MD   1 year ago Essential (primary) hypertension   [facility] Glean Hess, MD      Future Appointments            In 9 months [name] Jesse Sans, MD [facility], [facility]

## 2021-08-03 ENCOUNTER — Encounter: Payer: Self-pay | Admitting: Internal Medicine

## 2021-08-10 ENCOUNTER — Ambulatory Visit (INDEPENDENT_AMBULATORY_CARE_PROVIDER_SITE_OTHER): Payer: Medicare Other | Admitting: Internal Medicine

## 2021-08-10 ENCOUNTER — Encounter: Payer: Self-pay | Admitting: Internal Medicine

## 2021-08-10 VITALS — BP 122/80 | HR 49 | Ht 64.0 in | Wt 141.0 lb

## 2021-08-10 DIAGNOSIS — I10 Essential (primary) hypertension: Secondary | ICD-10-CM | POA: Diagnosis not present

## 2021-08-10 DIAGNOSIS — S61419A Laceration without foreign body of unspecified hand, initial encounter: Secondary | ICD-10-CM

## 2021-08-10 DIAGNOSIS — M545 Low back pain, unspecified: Secondary | ICD-10-CM

## 2021-08-10 MED ORDER — TIZANIDINE HCL 4 MG PO TABS: 4.0000 mg | ORAL_TABLET | Freq: Three times a day (TID) | ORAL | 0 refills | Status: DC | PRN

## 2021-08-10 MED ORDER — HYDROCODONE-ACETAMINOPHEN 5-325 MG PO TABS: 1.0000 | ORAL_TABLET | Freq: Four times a day (QID) | ORAL | 0 refills | Status: DC | PRN

## 2021-08-10 NOTE — Progress Notes (Signed)
Date:  08/10/2021   Name:  Belinda Sanchez   DOB:  [birth year]   MRN:  [id number]   Chief Complaint: Hypertension  Hypertension This is a chronic problem. The current episode started more than 1 year ago. The problem is unchanged. The problem is controlled. Pertinent negatives include no chest pain, palpitations or shortness of breath.  Back Pain This is a new problem. The current episode started more than 1 month ago. The problem occurs constantly. The problem has been waxing and waning since onset. The pain is present in the lumbar spine. Pertinent negatives include no chest pain or fever.  Laceration  Incident onset: 2 weeks. The laceration is located on the Left hand. The laceration is 4 cm in size. Injury mechanism: dryer and folding door in her home. The pain is at a severity of 3/10. The pain is mild. The pain has been Fluctuating since onset.    Lab Results  Component Value Date   NA 141 04/30/2021   K 4.1 04/30/2021   CO2 26 04/30/2021   GLUCOSE 92 04/30/2021   BUN 16 04/30/2021   CREATININE 0.69 04/30/2021   CALCIUM 9.9 04/30/2021   EGFR 89 04/30/2021   GFRNONAA 84 04/09/2020   Lab Results  Component Value Date   CHOL 300 (H) 04/30/2021   HDL 123 04/30/2021   LDLCALC 153 (H) 04/30/2021   TRIG 143 04/30/2021   CHOLHDL 2.4 04/30/2021   Lab Results  Component Value Date   TSH 7.510 (H) 04/30/2021   Lab Results  Component Value Date   HGBA1C 5.3 04/30/2021   Lab Results  Component Value Date   WBC 10.1 04/30/2021   HGB 15.1 04/30/2021   HCT 44.3 04/30/2021   MCV 97 04/30/2021   PLT 196 04/30/2021   Lab Results  Component Value Date   ALT 27 04/30/2021   AST 26 04/30/2021   ALKPHOS 74 04/30/2021   BILITOT 0.7 04/30/2021   Lab Results  Component Value Date   VD25OH 63.4 03/19/2019     Review of Systems  Constitutional:  Negative for chills, fatigue and fever.  Respiratory:  Negative for chest tightness and shortness of breath.   Cardiovascular:   Negative for chest pain and palpitations.  Musculoskeletal:  Positive for back pain and myalgias.  Skin:  Positive for wound.  Psychiatric/Behavioral:  Negative for dysphoric mood and sleep disturbance. The patient is not nervous/anxious.     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/2014   Acne erythematosa 12/03/2014   Acquired hypothyroidism 12/03/2014   Crohn's colitis, without complications ([facility]) 12/03/2014   Dyslipidemia 12/03/2014   Essential (primary) hypertension 12/03/2014   Idiopathic insomnia 12/03/2014   Osteoarthritis of right knee 12/03/2014   Avitaminosis D 12/03/2014   History of breast cancer 06/27/2014    Allergies  Allergen Reactions   Clindamycin Diarrhea and Other (See Comments)    Abdominal pain   Lozol  [Indapamide]     elevated serum calcium   Loratadine Palpitations    Past Surgical History:  Procedure Laterality Date   APPENDECTOMY     BREAST EXCISIONAL BIOPSY Right 04/2014   benign   COLONOSCOPY  08/2010   one polyp -  repeat 5 yrs   EYE SURGERY Right 04/02/2019   cataract    MASTECTOMY, RADICAL Left 2009   SHOULDER SURGERY  2016   AVN   TOTAL SHOULDER REPLACEMENT Right 09/2019   VAGINAL HYSTERECTOMY     total    Social History   Tobacco Use   Smoking status: Never   Smokeless tobacco: Never  Vaping Use   Vaping Use: Never used  Substance Use Topics   Alcohol use: Yes    Alcohol/week: 16.0 standard drinks of alcohol    Types: 2 Standard drinks or equivalent, 14 Glasses of wine per week   Drug use: No     Medication list has been reviewed and updated.  Current Meds  Medication Sig   alendronate (FOSAMAX) 35 MG tablet  Take 35 mg by mouth once a week.   folic acid (FOLVITE) 1 MG tablet Take by mouth.   HYDROcodone-acetaminophen (NORCO/VICODIN) 5-325 MG tablet Take 1 tablet by mouth every 6 (six) hours as needed.   hydroxychloroquine (PLAQUENIL) 200 MG tablet Take by mouth.   inFLIXimab (REMICADE) 100 MG injection Inject into the vein.   levothyroxine (SYNTHROID) 75 MCG tablet Take 1 tablet (75 mcg total) by mouth daily.   losartan (COZAAR) 50 MG tablet Take 1 tablet (50 mg total) by mouth daily.   methotrexate (RHEUMATREX) 2.5 MG tablet Take by mouth once a week. 4 tablets once a week   nebivolol (BYSTOLIC) 10 MG tablet Take 1 tablet (10 mg total) by mouth daily.   predniSONE (DELTASONE) 5 MG tablet Take 2.5 mg by mouth daily.   TIADYLT ER 240 MG 24 hr capsule TAKE 1 CAPSULE BY MOUTH EVERY DAY   valACYclovir (VALTREX) 1000 MG tablet TAKE ONE TABLET BY MOUTH TWICE A DAY (Patient taking differently: as needed.)   VITAMIN D PO Take by mouth.   zolpidem (AMBIEN CR) 12.5 MG CR tablet Take 1 tablet (12.5 mg total) by mouth at bedtime as needed. for sleep   [DISCONTINUED] methocarbamol (ROBAXIN) 500 MG tablet Take 1 tablet (500 mg total) by mouth 4 (four) times daily.       08/10/2021    8:19 AM 07/06/2021    8:11 AM 04/30/2021   10:47 AM 11/23/2020    1:39 PM  GAD 7 : Generalized Anxiety Score  Nervous, Anxious, on Edge 2 1 0 0  Control/stop worrying 2 2 0 0  Worry too much - different things 2 2 1  0  Trouble relaxing 2 2 0 0  Restless 1 1 0 0  Easily annoyed or irritable 1 1 0 0  Afraid - awful might happen 0 2 0 0  Total GAD 7 Score 10 11 1  0  Anxiety Difficulty Very difficult Very difficult         08/10/2021    8:19 AM  Depression screen PHQ 2/9  Decreased Interest 1  Down, Depressed, Hopeless 1  PHQ - 2 Score 2  Altered sleeping 2  Tired, decreased energy 1  Change in appetite 0  Feeling bad or failure about yourself  0  Trouble concentrating 0  Moving slowly or fidgety/restless 1   Suicidal thoughts 0  PHQ-9 Score 6  Difficult doing work/chores Very difficult    BP Readings from Last 3 Encounters:  08/10/21 122/80  07/06/21 128/84  04/30/21 118/78    Physical Exam Vitals and nursing note reviewed.  Constitutional:      General: She is not in acute distress.    Appearance: Normal appearance.  She is well-developed.     Comments: Mildly uncomfortable sitting.  HENT:     Head: Normocephalic and atraumatic.  Cardiovascular:     Rate and Rhythm: Normal rate and regular rhythm.  Pulmonary:     Effort: Pulmonary effort is normal. No respiratory distress.     Breath sounds: No wheezing or rhonchi.  Musculoskeletal:     Cervical back: Normal range of motion.     Lumbar back: Tenderness (over right SI region) present. No bony tenderness.     Right lower leg: No edema.     Left lower leg: No edema.  Lymphadenopathy:     Cervical: No cervical adenopathy.  Skin:    General: Skin is warm and dry.     Findings: Laceration (on dorsum of left hand - with eschar and healing - mild swelling but no obvious infection) present. No rash.  Neurological:     Mental Status: She is alert and oriented to person, place, and time.     Motor: Motor function is intact.     Gait: Gait is intact.  Psychiatric:        Mood and Affect: Mood normal.        Behavior: Behavior normal.     Wt Readings from Last 3 Encounters:  08/10/21 141 lb (64 kg)  07/06/21 148 lb (67.1 kg)  04/30/21 149 lb (67.6 kg)    BP 122/80   Pulse (!) 49   Ht 5\' 4"  (1.626 m)   Wt 141 lb (64 kg)   SpO2 94%   BMI 24.20 kg/m   Assessment and Plan: 1. Acute midline low back pain without sciatica S/p compression fracture L1 Continue activity as tolerated; hydrocodone prn At muscle relaxant; continue ice - HYDROcodone-acetaminophen (NORCO/VICODIN) 5-325 MG tablet; Take 1 tablet by mouth every 6 (six) hours as needed.  Dispense: 30 tablet; Refill: 0  2. Essential (primary) hypertension Clinically  stable exam with well controlled BP. Tolerating medications without side effects at this time. Pt to continue current regimen and low sodium diet; benefits of regular exercise as able discussed.  3. Laceration of dorsum of hand Healing - no indication for further antibiotics   Partially dictated using Animal nutritionist. Any errors are unintentional.  Bari Edward, MD [facility]  08/10/2021

## 2021-08-11 ENCOUNTER — Encounter: Payer: Self-pay | Admitting: Internal Medicine

## 2021-08-11 ENCOUNTER — Other Ambulatory Visit: Payer: Self-pay | Admitting: Internal Medicine

## 2021-08-11 NOTE — Telephone Encounter (Signed)
Please review.  KP

## 2021-08-13 ENCOUNTER — Other Ambulatory Visit: Payer: Self-pay | Admitting: Internal Medicine

## 2021-08-13 DIAGNOSIS — M545 Low back pain, unspecified: Secondary | ICD-10-CM

## 2021-08-13 MED ORDER — METHOCARBAMOL 500 MG PO TABS: 500.0000 mg | ORAL_TABLET | Freq: Four times a day (QID) | ORAL | 0 refills | Status: DC

## 2021-08-27 ENCOUNTER — Encounter: Payer: Self-pay | Admitting: Internal Medicine

## 2021-08-30 ENCOUNTER — Ambulatory Visit: Payer: Self-pay | Admitting: *Deleted

## 2021-08-30 NOTE — Telephone Encounter (Signed)
Please review.  KP

## 2021-08-30 NOTE — Telephone Encounter (Signed)
Abd pain, already saw MD and it is diverticulitis.She is on antibiotic. She called to make appt and would like to be on wait list. Her appt is currently virtual but she prefers in person but no opening until Sept.   Chief Complaint: appt, sooner Symptoms: on antibiotic for diverticulitis Frequency: this is a follow up appt Pertinent Negatives: Patient denies that it is getting worse, first day on full antibiotic dose is today. Disposition: [] ED /[] Urgent Care (no appt availability in office) / [x] Appointment(In office/virtual)/ []  [place] [facility]/ [] Home Care/ [] Refused Recommended Disposition /[] [place] [facility]/ []  Follow-up with PCP Additional Notes: Appt made, put on wait list. Pt has seen all the communication from Friday.

## 2021-09-30 ENCOUNTER — Ambulatory Visit (INDEPENDENT_AMBULATORY_CARE_PROVIDER_SITE_OTHER): Payer: Medicare Other | Admitting: Internal Medicine

## 2021-09-30 ENCOUNTER — Encounter: Payer: Self-pay | Admitting: Internal Medicine

## 2021-09-30 VITALS — BP 126/84 | HR 61 | Ht 64.0 in | Wt 140.0 lb

## 2021-09-30 DIAGNOSIS — K5010 Crohn's disease of large intestine without complications: Secondary | ICD-10-CM

## 2021-09-30 DIAGNOSIS — S32010A Wedge compression fracture of first lumbar vertebra, initial encounter for closed fracture: Secondary | ICD-10-CM | POA: Insufficient documentation

## 2021-09-30 DIAGNOSIS — S32010G Wedge compression fracture of first lumbar vertebra, subsequent encounter for fracture with delayed healing: Secondary | ICD-10-CM

## 2021-09-30 DIAGNOSIS — I10 Essential (primary) hypertension: Secondary | ICD-10-CM | POA: Diagnosis not present

## 2021-09-30 NOTE — Progress Notes (Signed)
Date:  09/30/2021   Name:  Belinda Sanchez   DOB:  05-01-1940   MRN:  [id number]   Chief Complaint: Diverticulitis (Follow up- pt feeling better /) and Back Pain  Back Pain This is a recurrent problem. The current episode started more than 1 month ago (2.5 mo ago - L1 compression fracture). The problem occurs constantly. The problem is unchanged. The quality of the pain is described as stabbing. Radiates to: right and left leg. The pain is at a severity of 7/10. The pain is moderate. The pain is Worse during the day. The symptoms are aggravated by twisting, standing and bending. Pertinent negatives include no abdominal pain, chest pain, headaches or weakness. She has tried ice for the symptoms. The treatment provided mild (seen by Ortho - Kyphoplasty mentioned but not recommended or referred) relief.  Hypertension This is a chronic problem. The problem is controlled (was very high while in the hospital.  Now back on her usual meds). Pertinent negatives include no chest pain, headaches, palpitations or shortness of breath. Past treatments include beta blockers and angiotensin blockers. The current treatment provides significant improvement. There is no history of kidney disease, CAD/MI or CVA.    Lab Results  Component Value Date   NA 141 04/30/2021   K 4.1 04/30/2021   CO2 26 04/30/2021   GLUCOSE 92 04/30/2021   BUN 16 04/30/2021   CREATININE 0.69 04/30/2021   CALCIUM 9.9 04/30/2021   EGFR 89 04/30/2021   GFRNONAA 84 04/09/2020   Lab Results  Component Value Date   CHOL 300 (H) 04/30/2021   HDL 123 04/30/2021   LDLCALC 153 (H) 04/30/2021   TRIG 143 04/30/2021   CHOLHDL 2.4 04/30/2021   Lab Results  Component Value Date   TSH 7.510 (H) 04/30/2021   Lab Results  Component Value Date   HGBA1C 5.3 04/30/2021   Lab Results  Component Value Date   WBC 10.1 04/30/2021   HGB 15.1 04/30/2021   HCT 44.3 04/30/2021   MCV 97 04/30/2021   PLT 196 04/30/2021   Lab Results   Component Value Date   ALT 27 04/30/2021   AST 26 04/30/2021   ALKPHOS 74 04/30/2021   BILITOT 0.7 04/30/2021   Lab Results  Component Value Date   VD25OH 63.4 03/19/2019     Review of Systems  Constitutional:  Negative for fatigue and unexpected weight change.  HENT:  Negative for nosebleeds.   Eyes:  Negative for visual disturbance.  Respiratory:  Negative for cough, chest tightness, shortness of breath and wheezing.   Cardiovascular:  Negative for chest pain, palpitations and leg swelling.  Gastrointestinal:  Negative for abdominal pain, constipation and diarrhea.  Musculoskeletal:  Positive for back pain.  Neurological:  Negative for dizziness, weakness, light-headedness and headaches.    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   Acne erythematosa 12/03/2014   Acquired hypothyroidism 12/03/2014   Crohn's colitis, without complications ([place]) 13/24/4010   Dyslipidemia 12/03/2014   Essential (primary) hypertension 12/03/2014   Idiopathic insomnia 12/03/2014   Osteoarthritis of right knee 12/03/2014   Avitaminosis D 12/03/2014   History of breast cancer 06/27/2014    Allergies  Allergen Reactions  Clindamycin Diarrhea and Other (See Comments)    Abdominal pain   Tizanidine Other (See Comments)    Dizziness, dry mouth   Lozol  [Indapamide]     elevated serum calcium   Sulfa Antibiotics Other (See Comments)    Reports excess bleeding. Advised not to take it by GI per patient Reports excess bleeding. Advised not to take it by GI per patient    Loratadine Palpitations    Past Surgical History:  Procedure Laterality Date   APPENDECTOMY     BREAST  EXCISIONAL BIOPSY Right 04/2014   benign   COLONOSCOPY  08/2010   one polyp - repeat 5 yrs   EYE SURGERY Right 04/02/2019   cataract    MASTECTOMY, RADICAL Left 2009   SHOULDER SURGERY  2016   AVN   TOTAL SHOULDER REPLACEMENT Right 09/2019   VAGINAL HYSTERECTOMY     total    Social History   Tobacco Use   Smoking status: Never   Smokeless tobacco: Never  Vaping Use   Vaping Use: Never used  Substance Use Topics   Alcohol use: Yes    Alcohol/week: 16.0 standard drinks of alcohol    Types: 2 Standard drinks or equivalent, 14 Glasses of wine per week   Drug use: No     Medication list has been reviewed and updated.  Current Meds  Medication Sig   alendronate (FOSAMAX) 35 MG tablet Take 35 mg by mouth once a week.   folic acid (FOLVITE) 1 MG tablet Take by mouth.   HYDROcodone-acetaminophen (NORCO/VICODIN) 5-325 MG tablet Take 1 tablet by mouth every 6 (six) hours as needed.   hydroxychloroquine (PLAQUENIL) 200 MG tablet Take by mouth.   inFLIXimab (REMICADE) 100 MG injection Inject into the vein.   levothyroxine (SYNTHROID) 75 MCG tablet Take 1 tablet (75 mcg total) by mouth daily.   losartan (COZAAR) 50 MG tablet Take 1 tablet (50 mg total) by mouth daily.   methotrexate (RHEUMATREX) 2.5 MG tablet Take by mouth once a week. 4 tablets once a week   nebivolol (BYSTOLIC) 10 MG tablet Take 1 tablet (10 mg total) by mouth daily.   predniSONE (DELTASONE) 5 MG tablet Take 2.5 mg by mouth daily.   TIADYLT ER 240 MG 24 hr capsule TAKE 1 CAPSULE BY MOUTH EVERY DAY   valACYclovir (VALTREX) 1000 MG tablet TAKE ONE TABLET BY MOUTH TWICE A DAY (Patient taking differently: as needed.)   VITAMIN D PO Take by mouth.   zolpidem (AMBIEN CR) 12.5 MG CR tablet Take 1 tablet (12.5 mg total) by mouth at bedtime as needed. for sleep       09/30/2021   11:21 AM 08/10/2021    8:19 AM 07/06/2021    8:11 AM 04/30/2021   10:47 AM  GAD 7 : Generalized Anxiety Score  Nervous, Anxious, on Edge _0 0  Control/stop worrying _1 0  Worry too much - different things _2 Trouble relaxing 0 2 2 0  Restless 0 1 1 0  Easily annoyed or irritable 0 1 1 0  Afraid - awful might happen 1 0 2 0  Total GAD 7 Score _3 Anxiety Difficulty Not difficult at all Very difficult Very difficult        09/30/2021   11:21 AM 08/10/2021    8:19 AM 07/06/2021    8:11 AM  Depression screen PHQ 2/9  Decreased Interest _4 Down,  Depressed, Hopeless _0 PHQ - 2 Score _1 Altered sleeping _2 Tired, decreased energy _3 Change in appetite 0 0 3  Feeling bad or failure about yourself  0 0 1  Trouble concentrating 0 0 1  Moving slowly or fidgety/restless 0 1 1  Suicidal thoughts 0 0 1  PHQ-9 Score _4 Difficult doing work/chores Not difficult at all Very difficult Very difficult    BP Readings from Last 3 Encounters:  09/30/21 126/84  08/10/21 122/80  07/06/21 128/84    Physical Exam Vitals and nursing note reviewed.  Constitutional:      General: She is not in acute distress.    Appearance: Normal appearance. She is well-developed.  HENT:     Head: Normocephalic and atraumatic.  Cardiovascular:     Rate and Rhythm: Normal rate and regular rhythm.  Pulmonary:     Effort: Pulmonary effort is normal. No respiratory distress.     Breath sounds: No wheezing or rhonchi.  Musculoskeletal:        General: No swelling.     Lumbar back: Spasms and bony tenderness present. Decreased range of motion.  Skin:    General: Skin is warm and dry.     Findings: No rash.  Neurological:     Mental Status: She is alert and oriented to person, place, and time.  Psychiatric:        Mood and Affect: Mood normal.        Behavior: Behavior normal.     Wt Readings from Last 3 Encounters:  09/30/21 140 lb (63.5 kg)  08/10/21 141 lb (64 kg)  07/06/21 148 lb (67.1 kg)    BP 126/84   Pulse 61   Ht _5  (1.626 m)   Wt 140 lb (63.5 kg)   SpO2 99%   BMI 24.03 kg/m    Assessment and Plan: 1. Compression fracture of L1 vertebra with delayed healing, subsequent encounter Still having moderate pain at this time Recommend follow up with Ortho for kyphoplasty consultation  2. Essential (primary) hypertension Clinically stable exam with well controlled BP. Tolerating medications without side effects at this time. Pt to continue current regimen and low sodium diet; benefits of regular exercise as able discussed.  3. Crohn's colitis, without complications ([place]) Recent flare up now improving Has colonoscopy scheduled for December with Dr. Comer Locket.    Partially dictated using Editor, commissioning. Any errors are unintentional.  Halina Maidens, MD [facility]  09/30/2021

## 2021-10-14 ENCOUNTER — Other Ambulatory Visit: Payer: Self-pay | Admitting: Internal Medicine

## 2021-10-14 DIAGNOSIS — E039 Hypothyroidism, unspecified: Secondary | ICD-10-CM

## 2021-10-15 NOTE — Telephone Encounter (Signed)
Requested Prescriptions  Pending Prescriptions Disp Refills  . levothyroxine (SYNTHROID) 75 MCG tablet [Pharmacy Med Name: LEVOTHYROXINE 75 MCG TABLET] 90 tablet 1    Sig: TAKE ONE TABLET BY MOUTH DAILY     Endocrinology:  Hypothyroid Agents Failed - 10/14/2021  8:09 AM      Failed - TSH in normal range and within 360 days    TSH  Date Value Ref Range Status  04/30/2021 7.510 (H) 0.450 - 4.500 uIU/mL Final         Passed - Valid encounter within last 12 months    Recent Outpatient Visits          2 weeks ago Compression fracture of L1 vertebra with delayed healing, subsequent encounter   [place] [facility], Jesse Sans, MD   2 months ago Essential (primary) hypertension   [place] [facility] at [facility], Jesse Sans, MD   3 months ago Acute midline low back pain without sciatica   [place] [facility] at [facility], Jesse Sans, MD   5 months ago Essential (primary) hypertension   [place] [facility] at [facility], Jesse Sans, MD   10 months ago Essential (primary) hypertension   [place] [facility] at [facility], Jesse Sans, MD      Future Appointments            In 6 months [name], Jesse Sans, MD [place] [facility] at [facility], [facility]

## 2021-11-19 ENCOUNTER — Other Ambulatory Visit: Payer: Self-pay | Admitting: Internal Medicine

## 2021-11-19 DIAGNOSIS — F5101 Primary insomnia: Secondary | ICD-10-CM

## 2021-11-22 NOTE — Telephone Encounter (Signed)
Requested medications are due for refill today.  yes  Requested medications are on the active medications list.  yes  Last refill. 04/30/2021 #30 5 rf  Future visit scheduled.   yes  Notes to clinic.  Refill not delegated    Requested Prescriptions  Pending Prescriptions Disp Refills   zolpidem (AMBIEN CR) 12.5 MG CR tablet [Pharmacy Med Name: ZOLPIDEM TART ER 12.5 MG TAB] 30 tablet     Sig: TAKE ONE TABLET BY MOUTH EVERY NIGHT AT BEDTIME AS NEEDED FOR SLEEP     Not Delegated - Psychiatry:  Anxiolytics/Hypnotics Failed - 11/19/2021  6:05 PM      Failed - This refill cannot be delegated      Failed - Urine Drug Screen completed in last 360 days      Passed - Valid encounter within last 6 months    Recent Outpatient Visits           1 month ago Compression fracture of L1 vertebra with delayed healing, subsequent encounter   [place] [facility] at [facility], Jesse Sans, MD   3 months ago Essential (primary) hypertension   [place] [facility] at [facility], Jesse Sans, MD   4 months ago Acute midline low back pain without sciatica   [place] [facility] at [facility], Jesse Sans, MD   6 months ago Essential (primary) hypertension   [place] [facility] at [facility], Jesse Sans, MD   12 months ago Essential (primary) hypertension   [place] [facility] at [facility], Jesse Sans, MD       Future Appointments             In 5 months [name], Jesse Sans, MD [place] [facility] at [facility], [facility]

## 2022-02-04 LAB — HM COLONOSCOPY

## 2022-02-25 ENCOUNTER — Telehealth: Payer: Self-pay | Admitting: Internal Medicine

## 2022-02-25 NOTE — Telephone Encounter (Signed)
Spoke with patient she stated her calendar was too full at this time to sched AWV.  Req CB 05/2022

## 2022-03-20 ENCOUNTER — Other Ambulatory Visit: Payer: Self-pay | Admitting: Internal Medicine

## 2022-03-20 DIAGNOSIS — E039 Hypothyroidism, unspecified: Secondary | ICD-10-CM

## 2022-04-21 ENCOUNTER — Other Ambulatory Visit: Payer: Self-pay | Admitting: Internal Medicine

## 2022-04-21 NOTE — Telephone Encounter (Signed)
Courtesy refill. Future visit in 2 weeks. Requested Prescriptions  Pending Prescriptions Disp Refills   TIADYLT ER 240 MG 24 hr capsule [Pharmacy Med Name: TIADYLT ER 240 MG CAPSULE] 90 capsule 0    Sig: TAKE 1 CAPSULE BY MOUTH EVERY DAY     Cardiovascular: Calcium Channel Blockers 3 Failed - 04/21/2022  1:33 AM      Failed - Valid encounter within last 6 months    Recent Outpatient Visits           6 months ago Compression fracture of L1 vertebra with delayed healing, subsequent encounter   [place] at [facility], Jesse Sans, MD   8 months ago Essential (primary) hypertension   [place] [facility] at [facility], Jesse Sans, MD   9 months ago Acute midline low back pain without sciatica   [place] [facility], Jesse Sans, MD   11 months ago Essential (primary) hypertension   [place] [facility] at [facility], Jesse Sans, MD   1 year ago Essential (primary) hypertension   [place] [facility] [place] [facility], Jesse Sans, MD       Future Appointments             In 2 weeks Glean Hess, MD [place] at [facility], [place] - ALT in normal range and within 360 days    ALT  Date Value Ref Range Status  04/30/2021 27 0 - 32 IU/L Final         Passed - AST in normal range and within 360 days    AST  Date Value Ref Range Status  04/30/2021 26 0 - 40 IU/L Final         Passed - Cr in normal range and within 360 days    Creatinine, Ser  Date Value Ref Range Status  04/30/2021 0.69 0.57 - 1.00 mg/dL Final         Passed - Last BP in normal range    BP Readings from Last 1 Encounters:  09/30/21 126/84         Passed - Last Heart Rate in normal range    Pulse Readings from Last 1 Encounters:  09/30/21 61

## 2022-05-05 ENCOUNTER — Ambulatory Visit (INDEPENDENT_AMBULATORY_CARE_PROVIDER_SITE_OTHER): Payer: Medicare Other | Admitting: Internal Medicine

## 2022-05-05 ENCOUNTER — Encounter: Payer: Self-pay | Admitting: Internal Medicine

## 2022-05-05 VITALS — BP 130/76 | HR 48 | Ht 64.0 in | Wt 154.0 lb

## 2022-05-05 DIAGNOSIS — E039 Hypothyroidism, unspecified: Secondary | ICD-10-CM | POA: Diagnosis not present

## 2022-05-05 DIAGNOSIS — M0579 Rheumatoid arthritis with rheumatoid factor of multiple sites without organ or systems involvement: Secondary | ICD-10-CM | POA: Diagnosis not present

## 2022-05-05 DIAGNOSIS — I10 Essential (primary) hypertension: Secondary | ICD-10-CM | POA: Diagnosis not present

## 2022-05-05 DIAGNOSIS — K5010 Crohn's disease of large intestine without complications: Secondary | ICD-10-CM

## 2022-05-05 DIAGNOSIS — Z0000 Encounter for general adult medical examination without abnormal findings: Secondary | ICD-10-CM

## 2022-05-05 DIAGNOSIS — E785 Hyperlipidemia, unspecified: Secondary | ICD-10-CM | POA: Diagnosis not present

## 2022-05-05 DIAGNOSIS — S32010G Wedge compression fracture of first lumbar vertebra, subsequent encounter for fracture with delayed healing: Secondary | ICD-10-CM

## 2022-05-05 DIAGNOSIS — F5101 Primary insomnia: Secondary | ICD-10-CM

## 2022-05-05 MED ORDER — LOSARTAN POTASSIUM 50 MG PO TABS: 50.0000 mg | ORAL_TABLET | Freq: Every day | ORAL | 3 refills | Status: DC

## 2022-05-05 MED ORDER — NEBIVOLOL HCL 10 MG PO TABS: 10.0000 mg | ORAL_TABLET | Freq: Every day | ORAL | 3 refills | Status: DC

## 2022-05-05 MED ORDER — ZOLPIDEM TARTRATE ER 12.5 MG PO TBCR: 12.5000 mg | EXTENDED_RELEASE_TABLET | Freq: Every day | ORAL | 5 refills | Status: DC

## 2022-05-05 NOTE — Assessment & Plan Note (Signed)
Still having low back pain from compression fx Healing slowly, not taking any pain medications Recommend resume walking

## 2022-05-05 NOTE — Assessment & Plan Note (Addendum)
Followed by Rheumatology On plaquenil and MTX plus Remicade and prednisone

## 2022-05-05 NOTE — Progress Notes (Signed)
Subjective:   Belinda Sanchez is a 82 y.o. female who presents for Medicare Annual (Subsequent) preventive examination.  I connected with  Harriett Sine on 05/05/22 by a  in person visit  and verified that I am speaking with the correct person using two identifiers.  Patient Location: Other:  in office  Provider Location: Office/Clinic  I discussed the limitations of evaluation and management by telemedicine. The patient expressed understanding and agreed to proceed.   Review of Systems    Defer to PCP Cardiac Risk Factors include: advanced age (>68men, >16 women)     Objective:    Today's Vitals   05/05/22 0952 05/05/22 1017  BP: 130/76   Pulse: (!) 48   SpO2: 98%   Weight: 154 lb (69.9 kg)   Height: 5\' 4"  (1.626 m)   PainSc: 0-No pain 4    Body mass index is 26.43 kg/m.     12/08/2016    9:40 AM 03/07/2016   10:29 AM 08/31/2015   10:07 AM 03/02/2015   10:48 AM  Advanced Directives  Does Patient Have a Medical Advance Directive? No;Yes Yes Yes Yes  Type of Paramedic of [place];Living will Living will;Healthcare Power of [place];Living will  Copy of [place] in Chart? No - copy requested       Current Medications (verified) Outpatient Encounter Medications as of 05/05/2022  Medication Sig   alendronate (FOSAMAX) 35 MG tablet Take 35 mg by mouth once a week.   diltiazem (TIADYLT ER) 240 MG 24 hr capsule TAKE 1 CAPSULE BY MOUTH EVERY DAY   folic acid (FOLVITE) 1 MG tablet Take by mouth.   HYDROcodone-acetaminophen (NORCO/VICODIN) 5-325 MG tablet Take 1 tablet by mouth every 6 (six) hours as needed.   hydroxychloroquine (PLAQUENIL) 200 MG tablet Take by mouth.   inFLIXimab (REMICADE) 100 MG injection Inject into the vein.   levothyroxine (SYNTHROID) 75 MCG tablet TAKE 1 TABLET BY MOUTH DAILY   losartan (COZAAR) 50 MG tablet Take 1 tablet (50 mg total) by mouth daily.   methotrexate  (RHEUMATREX) 2.5 MG tablet Take by mouth once a week. 4 tablets once a week   nebivolol (BYSTOLIC) 10 MG tablet Take 1 tablet (10 mg total) by mouth daily.   predniSONE (DELTASONE) 5 MG tablet Take 2.5 mg by mouth daily.   valACYclovir (VALTREX) 1000 MG tablet TAKE ONE TABLET BY MOUTH TWICE A DAY (Patient taking differently: as needed.)   VITAMIN D PO Take by mouth.   zolpidem (AMBIEN CR) 12.5 MG CR tablet TAKE ONE TABLET BY MOUTH EVERY NIGHT AT BEDTIME AS NEEDED FOR SLEEP   No facility-administered encounter medications on file as of 05/05/2022.    Allergies (verified) Clindamycin, Tizanidine, Lozol  [indapamide], Sulfa antibiotics, and Loratadine   History: Past Medical History:  Diagnosis Date   Hyperlipidemia    Hypertension    Hypothyroid    Past Surgical History:  Procedure Laterality Date   APPENDECTOMY     BREAST EXCISIONAL BIOPSY Right 04/2014   benign   COLONOSCOPY  08/2010   one polyp - repeat 5 yrs   EYE SURGERY Right 04/02/2019   cataract    MASTECTOMY, RADICAL Left 2009   SHOULDER SURGERY  2016   AVN   TOTAL SHOULDER REPLACEMENT Right 09/2019   VAGINAL HYSTERECTOMY     total   Family History  Problem Relation Age of Onset   Hypertension Mother    Diabetes Mother  Social History   Socioeconomic History   Marital status: Single    Spouse name: Not on file   Number of children: Not on file   Years of education: Not on file   Highest education level: Not on file  Occupational History   Occupation: receptionist  Tobacco Use   Smoking status: Never   Smokeless tobacco: Never  Vaping Use   Vaping Use: Never used  Substance and Sexual Activity   Alcohol use: Yes    Alcohol/week: 16.0 standard drinks of alcohol    Types: 2 Standard drinks or equivalent, 14 Glasses of wine per week   Drug use: No   Sexual activity: Not on file  Other Topics Concern   Not on file  Social History Narrative   Not on file   Social Determinants of Health   Financial  Resource Strain: Low Risk  (09/30/2021)   Overall Financial Resource Strain (CARDIA)    Difficulty of Paying Living Expenses: Not hard at all  Food Insecurity: No Food Insecurity (09/30/2021)   Hunger Vital Sign    Worried About Running Out of Food in the Last Year: Never true    Ran Out of Food in the Last Year: Never true  Transportation Needs: No Transportation Needs (09/30/2021)   PRAPARE - Hydrologist (Medical): No    Lack of Transportation (Non-Medical): No  Physical Activity: Inactive (05/05/2022)   Exercise Vital Sign    Days of Exercise per Week: 0 days    Minutes of Exercise per Session: 0 min  Stress: No Stress Concern Present (05/05/2022)   [place]    Feeling of Stress : Not at all  Social Connections: Not on file    Tobacco Counseling Counseling given: Not Answered   Clinical Intake:  Pre-visit preparation completed: Yes  Pain : 0-10 Pain Score: 4  Pain Type: Chronic pain Pain Location: Back Pain Onset: More than a month ago     Nutritional Status: BMI 25 -29 Overweight Nutritional Risks: None Diabetes: No     Diabetic? No  Interpreter Needed?: No  Information entered by :: Wyatt Haste, [place] [facility]    05/05/2022   10:09 AM  In your present state of health, do you have any difficulty performing the following activities:  Hearing? 0  Vision? 0  Difficulty concentrating or making decisions? 0  Walking or climbing stairs? 0  Dressing or bathing? 0  Doing errands, shopping? 0  Preparing Food and eating ? N  Using the Toilet? N  In the past six months, have you accidently leaked urine? N  Do you have problems with loss of bowel control? N  Managing your Medications? N  Managing your Finances? N  Housekeeping or managing your Housekeeping? N    Patient Care Team: Glean Hess, MD as PCP - General (Internal  Medicine) Comer Locket Rayvon Char (Inactive) as Referring Physician (Gastroenterology) Terese Door, [place] (Orthopedic Surgery) Quintin Alto, MD as Consulting Physician (Rheumatology)  Indicate any recent Medical Services you may have received from other than [facility] providers in the past year (date may be approximate).     Assessment:   This is a routine wellness examination for [place].  Hearing/Vision screen No results found.  Dietary issues and exercise activities discussed: Current Exercise Habits: The patient has a physically strenuous job, but has no regular exercise apart from work., Exercise limited by: orthopedic condition(s)  Goals Addressed   None    Depression Screen    05/05/2022   10:16 AM 09/30/2021   11:21 AM 08/10/2021    8:19 AM 07/06/2021    8:11 AM 04/30/2021   10:47 AM 11/23/2020    1:38 PM 06/11/2020   10:31 AM  PHQ 2/9 Scores  PHQ - 2 Score 1 2 2 2  0 0 0  PHQ- 9 Score 6 4 6 15 4 3  0    Fall Risk    05/05/2022   10:16 AM 09/30/2021   11:22 AM 08/10/2021    8:19 AM 07/06/2021    8:12 AM 04/30/2021   10:47 AM  Fall Risk   Falls in the past year? 1 1 1 1 1   Number falls in past yr: 1 0 0 1 0  Injury with Fall? 1 1 1  0 0  Risk for fall due to : No Fall Risks History of fall(s) History of fall(s) History of fall(s) History of fall(s)  Follow up Falls evaluation completed Falls evaluation completed Falls evaluation completed Falls evaluation completed Falls evaluation completed    [place]:  Any stairs in or around the home? Yes  If so, are there any without handrails? Yes  Home free of loose throw rugs in walkways, pet beds, electrical cords, etc? Yes  Adequate lighting in your home to reduce risk of falls? Yes   ASSISTIVE DEVICES UTILIZED TO PREVENT FALLS:  Life alert? No  Use of a cane, walker or w/c? No  Grab bars in the bathroom? No  Shower chair or bench in shower? No  Elevated toilet seat or a handicapped toilet? No    TIMED UP AND GO:  Was the test performed? Yes .   Gait steady and fast without use of assistive device  Cognitive Function:        05/05/2022   10:08 AM 12/08/2016    9:43 AM 03/07/2016   10:34 AM  6CIT Screen  What Year? 0 points 0 points 0 points  What month? 0 points 0 points 0 points  What time? 0 points 0 points 0 points  Count back from 20 0 points 0 points 0 points  Months in reverse 0 points 0 points 0 points  Repeat phrase 2 points 0 points 0 points  Total Score 2 points 0 points 0 points    Immunizations Immunization History  Administered Date(s) Administered   Fluad Quad(high Dose 65+) 11/19/2019   Influenza,inj,Quad PF,6+ Mos 12/15/2014, 10/26/2015, 10/16/2018   Influenza-Unspecified 11/22/2016, 12/06/2021   PFIZER Comirnaty(Gray Top)Covid-19 Tri-Sucrose Vaccine 03/21/2019, 04/11/2019   PFIZER(Purple Top)SARS-COV-2 Vaccination 02/26/2019, 03/21/2019, 04/11/2019   Pneumococcal Conjugate-13 12/23/2013   Pneumococcal Polysaccharide-23 02/16/2007, 12/10/2012   Rabies Immune Globulin 08/10/2016, 08/13/2016, 08/17/2016, 08/24/2016   Tdap 05/27/2013   Zoster Recombinat (Shingrix) 07/23/2016, 01/07/2017   Zoster, Live 02/15/2010    TDAP status: Up to date  Flu Vaccine status: Up to date  Pneumococcal vaccine status: Up to date  Covid-19 vaccine status: Completed vaccines  Qualifies for Shingles Vaccine? Yes   Zostavax completed No   Shingrix Completed?: Yes  Screening Tests Health Maintenance  Topic Date Due   COVID-19 Vaccine (6 - 2023-24 season) 05/21/2022 (Originally 10/15/2021)   MAMMOGRAM  12/07/2022   Medicare Annual Wellness (AWV)  05/05/2023   DTaP/Tdap/Td (2 - Td or Tdap) 05/28/2023   Pneumonia Vaccine 10+ Years old  Completed   INFLUENZA VACCINE  Completed   Hepatitis C Screening  Completed  Zoster Vaccines- Shingrix  Completed   DEXA SCAN  Addressed   HPV VACCINES  Aged Out   COLONOSCOPY (Pts 45-27yrs Insurance coverage will need to be  confirmed)  Discontinued    Health Maintenance  There are no preventive care reminders to display for this patient.  Colorectal cancer screening: No longer required.   Mammogram status: No longer required due to age.  Bone Density status: Completed 07/21/2012. Results reflect: Bone density results: NORMAL. Repeat every 3-5 years.  Lung Cancer Screening: (Low Dose CT Chest recommended if Age 93-80 years, 30 pack-year currently smoking OR have quit w/in 15years.) does not qualify.   Additional Screening:  Hepatitis C Screening: does qualify; Completed 04/30/2021  Vision Screening: Recommended annual ophthalmology exams for early detection of glaucoma and other disorders of the eye. Is the patient up to date with their annual eye exam?  Yes  Who is the provider or what is the name of the office in which the patient attends annual eye exams[facility] [place]: Recommended annual dental exams for proper oral hygiene  Community Resource Referral / Chronic Care Management: CRR required this visit?  No   CCM required this visit?  No    Plan:    I have personally reviewed and noted the following in the patient's chart:   Medical and social history Use of alcohol, tobacco or illicit drugs  Current medications and supplements including opioid prescriptions. Patient is not currently taking opioid prescriptions. Functional ability and status Nutritional status Physical activity Advanced directives List of other physicians Hospitalizations, surgeries, and ER visits in previous 12 months Vitals Screenings to include cognitive, depression, and falls Referrals and appointments  In addition, I have reviewed and discussed with patient certain preventive protocols, quality metrics, and best practice recommendations. A written personalized care plan for preventive services as well as general preventive health recommendations were provided to patient.      Clista Bernhardt, [place]   05/05/2022   Nurse Notes: None.

## 2022-05-05 NOTE — Assessment & Plan Note (Signed)
Stable symptoms on supplementation

## 2022-05-05 NOTE — Assessment & Plan Note (Signed)
On diet control alone Lab Results  Component Value Date   LDLCALC 153 (H) 04/30/2021

## 2022-05-05 NOTE — Progress Notes (Signed)
Date:  05/05/2022   Name:  Belinda Sanchez   DOB:  12-Apr-1940   MRN:  [id number]   Chief Complaint: Annual Exam Belinda Sanchez is a 82 y.o. female who presents today for her Complete Annual Exam. She feels fairly well. She reports exercising - cannot due to compression fx in back. She reports she is sleeping poorly. Breast complaints - none.  Mammogram: 10/23 DEXA: 07/2012 Colonoscopy: 01/2022  There are no preventive care reminders to display for this patient.   Immunization History  Administered Date(s) Administered   Fluad Quad(high Dose 65+) 11/19/2019   Influenza,inj,Quad PF,6+ Mos 12/15/2014, 10/26/2015, 10/16/2018   Influenza-Unspecified 11/22/2016, 12/06/2021   PFIZER Comirnaty(Gray Top)Covid-19 Tri-Sucrose Vaccine 03/21/2019, 04/11/2019   PFIZER(Purple Top)SARS-COV-2 Vaccination 02/26/2019, 03/21/2019, 04/11/2019   Pneumococcal Conjugate-13 12/23/2013   Pneumococcal Polysaccharide-23 02/16/2007, 12/10/2012   Rabies Immune Globulin 08/10/2016, 08/13/2016, 08/17/2016, 08/24/2016   Tdap 05/27/2013   Zoster Recombinat (Shingrix) 07/23/2016, 01/07/2017   Zoster, Live 02/15/2010    HPI  Lab Results  Component Value Date   NA 141 04/30/2021   K 4.1 04/30/2021   CO2 26 04/30/2021   GLUCOSE 92 04/30/2021   BUN 16 04/30/2021   CREATININE 0.69 04/30/2021   CALCIUM 9.9 04/30/2021   EGFR 89 04/30/2021   GFRNONAA 84 04/09/2020   Lab Results  Component Value Date   CHOL 300 (H) 04/30/2021   HDL 123 04/30/2021   LDLCALC 153 (H) 04/30/2021   TRIG 143 04/30/2021   CHOLHDL 2.4 04/30/2021   Lab Results  Component Value Date   TSH 7.510 (H) 04/30/2021   Lab Results  Component Value Date   HGBA1C 5.3 04/30/2021   Lab Results  Component Value Date   WBC 10.1 04/30/2021   HGB 15.1 04/30/2021   HCT 44.3 04/30/2021   MCV 97 04/30/2021   PLT 196 04/30/2021   Lab Results  Component Value Date   ALT 27 04/30/2021   AST 26 04/30/2021   ALKPHOS 74 04/30/2021   BILITOT  0.7 04/30/2021   Lab Results  Component Value Date   VD25OH 63.4 03/19/2019     Review of Systems  Constitutional:  Negative for chills, fatigue and fever.  HENT:  Negative for congestion, hearing loss, tinnitus, trouble swallowing and voice change.   Eyes:  Negative for visual disturbance.  Respiratory:  Negative for cough, chest tightness, shortness of breath and wheezing.   Cardiovascular:  Negative for chest pain, palpitations and leg swelling.  Gastrointestinal:  Negative for abdominal pain, constipation, diarrhea and vomiting.  Endocrine: Negative for polydipsia and polyuria.  Genitourinary:  Negative for dysuria, frequency, genital sores, vaginal bleeding and vaginal discharge.  Musculoskeletal:  Positive for arthralgias and back pain. Negative for gait problem and joint swelling.  Skin:  Negative for color change and rash.  Neurological:  Negative for dizziness, tremors, light-headedness and headaches.  Hematological:  Negative for adenopathy. Does not bruise/bleed easily.  Psychiatric/Behavioral:  Positive for sleep disturbance. Negative for dysphoric mood. The patient is not nervous/anxious.     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   Herpes simplex infection 12/15/2014   Acne erythematosa 12/03/2014   Acquired hypothyroidism 12/03/2014  Crohn's colitis, without complications ([place]) [id number]   Dyslipidemia 12/03/2014   Essential (primary) hypertension 12/03/2014   Idiopathic insomnia 12/03/2014   Osteoarthritis of right knee 12/03/2014   Avitaminosis D 12/03/2014   History of breast cancer 06/27/2014    Allergies  Allergen  Reactions   Clindamycin Diarrhea and Other (See Comments)    Abdominal pain   Tizanidine Other (See Comments)    Dizziness, dry mouth   Lozol  [Indapamide]     elevated serum calcium   Sulfa Antibiotics Other (See Comments)    Reports excess bleeding. Advised not to take it by GI per patient Reports excess bleeding. Advised not to take it by GI per patient    Loratadine Palpitations    Past Surgical History:  Procedure Laterality Date   APPENDECTOMY     BREAST EXCISIONAL BIOPSY Right 04/2014   benign   COLONOSCOPY  08/2010   one polyp - repeat 5 yrs   EYE SURGERY Right 04/02/2019   cataract    MASTECTOMY, RADICAL Left 2009   SHOULDER SURGERY  2016   AVN   TOTAL SHOULDER REPLACEMENT Right 09/2019   VAGINAL HYSTERECTOMY     total    Social History   Tobacco Use   Smoking status: Never   Smokeless tobacco: Never  Vaping Use   Vaping Use: Never used  Substance Use Topics   Alcohol use: Yes    Alcohol/week: 16.0 standard drinks of alcohol    Types: 2 Standard drinks or equivalent, 14 Glasses of wine per week   Drug use: No     Medication list has been reviewed and updated.  Current Meds  Medication Sig   alendronate (FOSAMAX) 35 MG tablet Take 35 mg by mouth once a week.   diltiazem (TIADYLT ER) 240 MG 24 hr capsule TAKE 1 CAPSULE BY MOUTH EVERY DAY   folic acid (FOLVITE) 1 MG tablet Take by mouth.   HYDROcodone-acetaminophen (NORCO/VICODIN) 5-325 MG tablet Take 1 tablet by mouth every 6 (six) hours as needed.   hydroxychloroquine (PLAQUENIL) 200 MG tablet Take by mouth.   inFLIXimab (REMICADE) 100 MG injection Inject into the vein.   levothyroxine (SYNTHROID) 75 MCG tablet TAKE 1 TABLET BY MOUTH DAILY   methotrexate (RHEUMATREX) 2.5 MG tablet Take by mouth once a week. 4 tablets once a week   predniSONE (DELTASONE) 5 MG tablet Take 2.5 mg by mouth daily.   valACYclovir (VALTREX) 1000 MG tablet TAKE ONE TABLET BY MOUTH TWICE A DAY (Patient taking differently: as  needed.)   VITAMIN D PO Take by mouth.   [DISCONTINUED] losartan (COZAAR) 50 MG tablet Take 1 tablet (50 mg total) by mouth daily.   [DISCONTINUED] nebivolol (BYSTOLIC) 10 MG tablet Take 1 tablet (10 mg total) by mouth daily.   [DISCONTINUED] zolpidem (AMBIEN CR) 12.5 MG CR tablet TAKE ONE TABLET BY MOUTH EVERY NIGHT AT BEDTIME AS NEEDED FOR SLEEP       05/05/2022   10:16 AM 09/30/2021   11:21 AM 08/10/2021    8:19 AM 07/06/2021    8:11 AM  GAD 7 : Generalized Anxiety Score  Nervous, Anxious, on Edge 1 1 2 1   Control/stop worrying 0 1 2 2   Worry too much - different things 0 1 2 2   Trouble relaxing 0 0 2 2  Restless 0 0 1 1  Easily annoyed or irritable 0 0 1 1  Afraid - awful might happen 1 1 0 2  Total GAD 7 Score 2 4 10  11  Anxiety Difficulty Not difficult at all Not difficult at all Very difficult Very difficult       05/05/2022   10:16 AM 09/30/2021   11:21 AM 08/10/2021    8:19 AM  Depression screen PHQ 2/9  Decreased Interest 1 1 1   Down, Depressed, Hopeless  1 1  PHQ - 2 Score 1 2 2   Altered sleeping 3 1 2   Tired, decreased energy 1 1 1   Change in appetite 1 0 0  Feeling bad or failure about yourself  0 0 0  Trouble concentrating 0 0 0  Moving slowly or fidgety/restless 0 0 1  Suicidal thoughts 0 0 0  PHQ-9 Score 6 4 6   Difficult doing work/chores Not difficult at all Not difficult at all Very difficult    BP Readings from Last 3 Encounters:  05/05/22 130/76  09/30/21 126/84  08/10/21 122/80    Physical Exam Vitals and nursing note reviewed.  Constitutional:      General: She is not in acute distress.    Appearance: She is well-developed.  HENT:     Head: Normocephalic and atraumatic.     Right Ear: Tympanic membrane and ear canal normal.     Left Ear: Tympanic membrane and ear canal normal.     Nose:     Right Sinus: No maxillary sinus tenderness.     Left Sinus: No maxillary sinus tenderness.  Eyes:     General: No scleral icterus.       Right eye:  No discharge.        Left eye: No discharge.     Conjunctiva/sclera: Conjunctivae normal.  Neck:     Thyroid: No thyromegaly.     Vascular: No carotid bruit.  Cardiovascular:     Rate and Rhythm: Normal rate and regular rhythm.     Pulses: Normal pulses.     Heart sounds: Normal heart sounds.  Pulmonary:     Effort: Pulmonary effort is normal. No respiratory distress.     Breath sounds: No wheezing.  Abdominal:     General: Bowel sounds are normal.     Palpations: Abdomen is soft.     Tenderness: There is no abdominal tenderness.  Musculoskeletal:     Cervical back: Normal range of motion. No erythema.     Right lower leg: No edema.     Left lower leg: No edema.  Lymphadenopathy:     Cervical: No cervical adenopathy.  Skin:    General: Skin is warm and dry.     Findings: No rash.  Neurological:     Mental Status: She is alert and oriented to person, place, and time.     Cranial Nerves: No cranial nerve deficit.     Sensory: No sensory deficit.     Deep Tendon Reflexes: Reflexes are normal and symmetric.  Psychiatric:        Attention and Perception: Attention normal.        Mood and Affect: Mood normal.     Wt Readings from Last 3 Encounters:  05/05/22 154 lb (69.9 kg)  09/30/21 140 lb (63.5 kg)  08/10/21 141 lb (64 kg)    BP 130/76   Pulse (!) 48   Ht 5\' 4"  (1.626 m)   Wt 154 lb (69.9 kg)   SpO2 98%   BMI 26.43 kg/m   Assessment and Plan:  Problem List Items Addressed This Visit       Cardiovascular and Mediastinum   Essential (primary)  hypertension - Primary (Chronic)    Clinically stable exam with well controlled BP on losartan, bystolic and cardiazem. Tolerating medications without side effects. Pt to continue current regimen and low sodium diet.       Relevant Medications   losartan (COZAAR) 50 MG tablet   nebivolol (BYSTOLIC) 10 MG tablet   Other Relevant Orders   CBC with Differential/Platelet   Comprehensive metabolic panel     Digestive    Crohn's colitis, without complications (HCC)   Relevant Orders   Comprehensive metabolic panel     Endocrine   Acquired hypothyroidism (Chronic)    Stable symptoms on supplementation      Relevant Medications   nebivolol (BYSTOLIC) 10 MG tablet   Other Relevant Orders   TSH + free T4     Musculoskeletal and Integument   Compression fracture of L1 lumbar vertebra (HCC)    Still having low back pain from compression fx Healing slowly, not taking any pain medications Recommend resume walking      Rheumatoid arthritis (HCC) (Chronic)    Followed by Rheumatology On plaquenil and MTX plus Remicade and prednisone        Other   Dyslipidemia (Chronic)    On diet control alone Lab Results  Component Value Date   LDLCALC 153 (H) 04/30/2021        Relevant Orders   Lipid panel   Idiopathic insomnia (Chronic)   Relevant Medications   zolpidem (AMBIEN CR) 12.5 MG CR tablet   Other Visit Diagnoses     Encounter for Medicare annual wellness exam           Return in 6 months (on 11/05/2022) for HTN.   Partially dictated using [place], any errors are not intentional.  Glean Hess, MD [place], [place]

## 2022-05-05 NOTE — Assessment & Plan Note (Signed)
Clinically stable exam with well controlled BP on losartan, bystolic and cardiazem. Tolerating medications without side effects. Pt to continue current regimen and low sodium diet.

## 2022-05-06 LAB — CBC WITH DIFFERENTIAL/PLATELET
Basophils Absolute: 0.1 10*3/uL (ref 0.0–0.2)
Basos: 1 %
EOS (ABSOLUTE): 0.2 10*3/uL (ref 0.0–0.4)
Eos: 2 %
Hematocrit: 44 % (ref 34.0–46.6)
Hemoglobin: 14.8 g/dL (ref 11.1–15.9)
Immature Grans (Abs): 0 10*3/uL (ref 0.0–0.1)
Immature Granulocytes: 0 %
Lymphocytes Absolute: 2.1 10*3/uL (ref 0.7–3.1)
Lymphs: 29 %
MCH: 33.2 pg — ABNORMAL HIGH (ref 26.6–33.0)
MCHC: 33.6 g/dL (ref 31.5–35.7)
MCV: 99 fL — ABNORMAL HIGH (ref 79–97)
Monocytes Absolute: 0.7 10*3/uL (ref 0.1–0.9)
Monocytes: 10 %
Neutrophils Absolute: 4.3 10*3/uL (ref 1.4–7.0)
Neutrophils: 58 %
Platelets: 192 10*3/uL (ref 150–450)
RBC: 4.46 x10E6/uL (ref 3.77–5.28)
RDW: 12 % (ref 11.7–15.4)
WBC: 7.5 10*3/uL (ref 3.4–10.8)

## 2022-05-06 LAB — LIPID PANEL
Chol/HDL Ratio: 2.4 ratio (ref 0.0–4.4)
Cholesterol, Total: 261 mg/dL — ABNORMAL HIGH (ref 100–199)
HDL: 109 mg/dL (ref >39)
LDL Chol Calc (NIH): 134 mg/dL — ABNORMAL HIGH (ref 0–99)
Triglycerides: 110 mg/dL (ref 0–149)
VLDL Cholesterol Cal: 18 mg/dL (ref 5–40)

## 2022-05-06 LAB — COMPREHENSIVE METABOLIC PANEL
ALT: 22 IU/L (ref 0–32)
AST: 28 IU/L (ref 0–40)
Albumin/Globulin Ratio: 1.9 (ref 1.2–2.2)
Albumin: 4.6 g/dL (ref 3.8–4.8)
Alkaline Phosphatase: 74 IU/L (ref 44–121)
BUN/Creatinine Ratio: 22 (ref 12–28)
BUN: 20 mg/dL (ref 8–27)
Bilirubin Total: 0.7 mg/dL (ref 0.0–1.2)
CO2: 23 mmol/L (ref 20–29)
Calcium: 10.2 mg/dL (ref 8.7–10.3)
Chloride: 100 mmol/L (ref 96–106)
Creatinine, Ser: 0.91 mg/dL (ref 0.57–1.00)
Globulin, Total: 2.4 g/dL (ref 1.5–4.5)
Glucose: 96 mg/dL (ref 70–99)
Potassium: 4.4 mmol/L (ref 3.5–5.2)
Sodium: 141 mmol/L (ref 134–144)
Total Protein: 7 g/dL (ref 6.0–8.5)
eGFR: 64 mL/min/{1.73_m2} (ref >59)

## 2022-05-06 LAB — TSH+FREE T4
Free T4: 1.46 ng/dL (ref 0.82–1.77)
TSH: 10.5 u[IU]/mL — ABNORMAL HIGH (ref 0.450–4.500)

## 2022-05-08 ENCOUNTER — Other Ambulatory Visit: Payer: Self-pay | Admitting: Internal Medicine

## 2022-05-08 DIAGNOSIS — I10 Essential (primary) hypertension: Secondary | ICD-10-CM

## 2022-06-13 ENCOUNTER — Other Ambulatory Visit: Payer: Self-pay | Admitting: Internal Medicine

## 2022-06-13 DIAGNOSIS — E039 Hypothyroidism, unspecified: Secondary | ICD-10-CM

## 2022-07-20 ENCOUNTER — Other Ambulatory Visit: Payer: Self-pay | Admitting: Internal Medicine

## 2022-07-20 NOTE — Telephone Encounter (Signed)
Requested Prescriptions  Pending Prescriptions Disp Refills   TIADYLT ER 240 MG 24 hr capsule [Pharmacy Med Name: TIADYLT ER 240 MG CAPSULE] 90 capsule 0    Sig: TAKE 1 CAPSULE BY MOUTH EVERY DAY     Cardiovascular: Calcium Channel Blockers 3 Passed - 07/20/2022  2:08 AM      Passed - ALT in normal range and within 360 days    ALT  Date Value Ref Range Status  05/05/2022 22 0 - 32 IU/L Final         Passed - AST in normal range and within 360 days    AST  Date Value Ref Range Status  05/05/2022 28 0 - 40 IU/L Final         Passed - Cr in normal range and within 360 days    Creatinine, Ser  Date Value Ref Range Status  05/05/2022 0.91 0.57 - 1.00 mg/dL Final         Passed - Last BP in normal range    BP Readings from Last 1 Encounters:  05/05/22 130/76         Passed - Last Heart Rate in normal range    Pulse Readings from Last 1 Encounters:  05/05/22 (!) 48         Passed - Valid encounter within last 6 months    Recent Outpatient Visits           2 months ago Essential (primary) hypertension   [place] [facility] at [facility], Nyoka Cowden, MD   9 months ago Compression fracture of L1 vertebra with delayed healing, subsequent encounter   [place] [facility] at [facility], Nyoka Cowden, MD   11 months ago Essential (primary) hypertension   [place] [facility] at [facility], Nyoka Cowden, MD   1 year ago Acute midline low back pain without sciatica   [place] [facility] at [facility], Nyoka Cowden, MD   1 year ago Essential (primary) hypertension   [place] [facility] at [facility], Nyoka Cowden, MD       Future Appointments             In 9 months Judithann Graves, Nyoka Cowden, MD [facility] Medicine at [facility], [facility]

## 2022-07-29 ENCOUNTER — Ambulatory Visit: Payer: Self-pay

## 2022-07-29 NOTE — Telephone Encounter (Signed)
    Chief Complaint: Upper abdominal pain, burning sensation Symptoms: Above Frequency: 1 week ago Pertinent Negatives: Patient denies diarrhea, constipation Disposition: [] ED /[] Urgent Care (no appt availability in office) / [x] Appointment(In office/virtual)/ []  [place] [facility]/ [] Home Care/ [] Refused Recommended Disposition /[] [place] [place] [facility]/ []  Follow-up with PCP Additional Notes:   Reason for Disposition  [1] MODERATE pain (e.g., interferes with normal activities) AND [2] pain comes and goes (cramps) AND [3] present > 24 hours  (Exception: Pain with Vomiting or Diarrhea - see that Guideline.)  Answer Assessment - Initial Assessment Questions 1. LOCATION: "Where does it hurt?"      Upper abdomen 2. RADIATION: "Does the pain shoot anywhere else?" (e.g., chest, back)     No 3. ONSET: "When did the pain begin?" (e.g., minutes, hours or days ago)      1 week ago 4. SUDDEN: "Gradual or sudden onset?"     Gradual 5. PATTERN "Does the pain come and go, or is it constant?"    - If it comes and goes: "How long does it last?" "Do you have pain now?"     (Note: Comes and goes means the pain is intermittent. It goes away completely between bouts.)    - If constant: "Is it getting better, staying the same, or getting worse?"      (Note: Constant means the pain never goes away completely; most serious pain is constant and gets worse.)      Comes and goes 6. SEVERITY: "How bad is the pain?"  (e.g., Scale 1-10; mild, moderate, or severe)    - MILD (1-3): Doesn't interfere with normal activities, abdomen soft and not tender to touch.     - MODERATE (4-7): Interferes with normal activities or awakens from sleep, abdomen tender to touch.     - SEVERE (8-10): Excruciating pain, doubled over, unable to do any normal activities.       Burning - last night 9 7. RECURRENT SYMPTOM: "Have you ever had this type of stomach pain before?" If Yes, ask: "When was the last time?" and "What  happened that time?"      No 8. CAUSE: "What do you think is causing the stomach pain?"     Unsure 9. RELIEVING/AGGRAVATING FACTORS: "What makes it better or worse?" (e.g., antacids, bending or twisting motion, bowel movement)     No 10. OTHER SYMPTOMS: "Do you have any other symptoms?" (e.g., back pain, diarrhea, fever, urination pain, vomiting)       No 11. PREGNANCY: "Is there any chance you are pregnant?" "When was your last menstrual period?"       No  Protocols used: Abdominal Pain - [facility]

## 2022-08-01 ENCOUNTER — Encounter: Payer: Self-pay | Admitting: Internal Medicine

## 2022-08-01 ENCOUNTER — Telehealth: Payer: Self-pay | Admitting: Internal Medicine

## 2022-08-01 ENCOUNTER — Ambulatory Visit (INDEPENDENT_AMBULATORY_CARE_PROVIDER_SITE_OTHER): Payer: Medicare Other | Admitting: Internal Medicine

## 2022-08-01 VITALS — BP 110/70 | HR 50 | Ht 64.0 in | Wt 156.0 lb

## 2022-08-01 DIAGNOSIS — K219 Gastro-esophageal reflux disease without esophagitis: Secondary | ICD-10-CM | POA: Insufficient documentation

## 2022-08-01 MED ORDER — OMEPRAZOLE 40 MG PO CPDR
40.0000 mg | DELAYED_RELEASE_CAPSULE | Freq: Every day | ORAL | 0 refills | Status: DC
Start: 2022-08-01 — End: 2022-08-29

## 2022-08-01 NOTE — Assessment & Plan Note (Addendum)
New problem with recent onset most suggestive of GERD. No red flag s/s today. No new medications, change in diet or improvement with TUMS Will obtain H Pylori today Begin omeprazole 40 mg daily x 30 days.

## 2022-08-01 NOTE — Telephone Encounter (Signed)
Patient came back into office and wanted Dr. Judithann Graves to know that she could not do her test today at [facility]. Patient stated that they were going to lunch and she would take it tomorrow.

## 2022-08-01 NOTE — Telephone Encounter (Signed)
fyi

## 2022-08-01 NOTE — Progress Notes (Signed)
Date:  08/01/2022   Name:  Belinda Sanchez   DOB:  05-Apr-1940   MRN:  [id number]   Chief Complaint: Abdominal Pain (Bloating, gas burping occurs at night when lying down- hurting under bra line. Belching is all day)  Gastroesophageal Reflux She complains of abdominal pain, heartburn and water brash. She reports no chest pain, no coughing or no nausea. This is a new problem. The current episode started in the past 7 days. The problem occurs constantly. The problem has been waxing and waning. Pertinent negatives include no fatigue. She has tried an antacid for the symptoms. The treatment provided no relief.    Lab Results  Component Value Date   NA 141 05/05/2022   K 4.4 05/05/2022   CO2 23 05/05/2022   GLUCOSE 96 05/05/2022   BUN 20 05/05/2022   CREATININE 0.91 05/05/2022   CALCIUM 10.2 05/05/2022   EGFR 64 05/05/2022   GFRNONAA 84 04/09/2020   Lab Results  Component Value Date   CHOL 261 (H) 05/05/2022   HDL 109 05/05/2022   LDLCALC 134 (H) 05/05/2022   TRIG 110 05/05/2022   CHOLHDL 2.4 05/05/2022   Lab Results  Component Value Date   TSH 10.500 (H) 05/05/2022   Lab Results  Component Value Date   HGBA1C 5.3 04/30/2021   Lab Results  Component Value Date   WBC 7.5 05/05/2022   HGB 14.8 05/05/2022   HCT 44.0 05/05/2022   MCV 99 (H) 05/05/2022   PLT 192 05/05/2022   Lab Results  Component Value Date   ALT 22 05/05/2022   AST 28 05/05/2022   ALKPHOS 74 05/05/2022   BILITOT 0.7 05/05/2022   Lab Results  Component Value Date   VD25OH 63.4 03/19/2019     Review of Systems  Constitutional:  Negative for chills, fatigue, fever and unexpected weight change.  Respiratory:  Negative for cough, chest tightness and shortness of breath.   Cardiovascular:  Negative for chest pain.  Gastrointestinal:  Positive for abdominal distention, abdominal pain and heartburn. Negative for constipation, diarrhea, nausea and vomiting.  Neurological:  Negative for dizziness and  light-headedness.  Psychiatric/Behavioral:  Negative for dysphoric mood and sleep disturbance. The patient is not nervous/anxious.     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   Acne erythematosa 12/03/2014   Acquired hypothyroidism 12/03/2014   Crohn's colitis, without complications (HCC) 12/03/2014   Dyslipidemia 12/03/2014   Essential (primary) hypertension 12/03/2014   Idiopathic insomnia 12/03/2014   Osteoarthritis of right knee 12/03/2014   Avitaminosis D 12/03/2014   History of breast cancer 06/27/2014    Allergies  Allergen Reactions   Clindamycin Diarrhea and Other (See Comments)    Abdominal pain   Tizanidine Other (See Comments)    Dizziness, dry mouth   Lozol  [Indapamide]     elevated serum calcium   Sulfa Antibiotics Other (See Comments)    Reports excess bleeding. Advised not to take it by GI per patient Reports excess bleeding. Advised not to take it by GI per patient    Loratadine Palpitations    Past Surgical History:  Procedure Laterality Date   APPENDECTOMY     BREAST EXCISIONAL BIOPSY Right 04/2014   benign   COLONOSCOPY  08/2010   one polyp - repeat 5 yrs   EYE SURGERY Right 04/02/2019   cataract    MASTECTOMY, RADICAL Left 2009   SHOULDER SURGERY  2016   AVN   TOTAL SHOULDER REPLACEMENT Right 09/2019   VAGINAL HYSTERECTOMY     total    Social History   Tobacco Use   Smoking status: Never   Smokeless tobacco: Never  Vaping Use   Vaping Use: Never used  Substance Use Topics   Alcohol use: Yes    Alcohol/week: 16.0  standard drinks of alcohol    Types: 2 Standard drinks or equivalent, 14 Glasses of wine per week   Drug use: No     Medication list has been reviewed and updated.  Current Meds  Medication Sig   alendronate (FOSAMAX) 35 MG tablet Take 35 mg by mouth once a week.   folic acid (FOLVITE) 1 MG tablet Take 1 mg by mouth daily.   hydroxychloroquine (PLAQUENIL) 200 MG tablet Take by mouth.   inFLIXimab (REMICADE) 100 MG injection Inject into the vein.   levothyroxine (SYNTHROID) 75 MCG tablet TAKE 1 TABLET BY MOUTH DAILY   losartan (COZAAR) 50 MG tablet TAKE ONE TABLET BY MOUTH DAILY   methotrexate (RHEUMATREX) 2.5 MG tablet Take by mouth once a week. 4 tablets once a week   nebivolol (BYSTOLIC) 10 MG tablet Take 1 tablet (10 mg total) by mouth daily.   omeprazole (PRILOSEC) 40 MG capsule Take 1 capsule (40 mg total) by mouth daily.   predniSONE (DELTASONE) 5 MG tablet Take 2.5 mg by mouth daily.   TIADYLT ER 240 MG 24 hr capsule TAKE 1 CAPSULE BY MOUTH EVERY DAY   VITAMIN D PO Take by mouth.   zolpidem (AMBIEN CR) 12.5 MG CR tablet Take 1 tablet (12.5 mg total) by mouth at bedtime.       08/01/2022   11:32 AM 05/05/2022   10:16 AM 09/30/2021   11:21 AM 08/10/2021    8:19 AM  GAD 7 : Generalized Anxiety Score  Nervous, Anxious, on Edge 0 1 1 2   Control/stop worrying 0 0 1 2  Worry too much - different things 0 0 1 2  Trouble relaxing 0 0 0 2  Restless 1 0 0 1  Easily annoyed or irritable 0 0 0 1  Afraid - awful might happen 0 1 1 0  Total GAD 7 Score 1 2 4 10   Anxiety Difficulty Not difficult at all Not difficult at all Not difficult at all Very difficult       08/01/2022   11:32 AM 05/05/2022   10:16 AM 09/30/2021   11:21 AM  Depression screen PHQ 2/9  Decreased Interest 0 1 1  Down, Depressed, Hopeless 0  1  PHQ - 2 Score 0 1 2  Altered sleeping 3 3 1   Tired, decreased energy 0 1 1  Change in appetite 0 1 0  Feeling bad or failure about yourself  0 0 0  Trouble  concentrating 0 0 0  Moving slowly or fidgety/restless 0 0 0  Suicidal thoughts 0 0 0  PHQ-9 Score 3 6 4   Difficult doing work/chores Not difficult at all Not difficult at all Not difficult at all    BP Readings from Last 3 Encounters:  08/01/22 110/70  05/05/22 130/76  09/30/21 126/84    Physical  Exam Constitutional:      General: She is not in acute distress.    Appearance: She is well-developed.  Abdominal:     General: Abdomen is flat. Bowel sounds are normal. There is no distension.     Palpations: Abdomen is soft. There is hepatomegaly and splenomegaly.     Tenderness: There is no abdominal tenderness. There is no guarding or rebound.  Neurological:     Mental Status: She is alert.     Wt Readings from Last 3 Encounters:  08/01/22 156 lb (70.8 kg)  05/05/22 154 lb (69.9 kg)  09/30/21 140 lb (63.5 kg)    BP 110/70   Pulse (!) 50   Ht 5\' 4"  (1.626 m)   Wt 156 lb (70.8 kg)   SpO2 97%   BMI 26.78 kg/m   Assessment and Plan:  Problem List Items Addressed This Visit     Gastroesophageal reflux disease - Primary    New problem with recent onset most suggestive of GERD. No red flag s/s today. No new medications, change in diet or improvement with TUMS Will obtain H Pylori today Begin omeprazole 40 mg daily x 30 days.      Relevant Medications   omeprazole (PRILOSEC) 40 MG capsule   Other Relevant Orders   H. pylori breath test    No follow-ups on file.   Partially dictated using Dragon software, any errors are not intentional.  Reubin Milan, MD [facility] Medicine [place], [place]

## 2022-08-03 ENCOUNTER — Ambulatory Visit: Payer: Medicare Other | Admitting: Internal Medicine

## 2022-08-04 ENCOUNTER — Encounter: Payer: Self-pay | Admitting: Internal Medicine

## 2022-08-04 LAB — H. PYLORI BREATH TEST: H pylori Breath Test: NEGATIVE

## 2022-08-05 NOTE — Telephone Encounter (Signed)
Please review.  KP

## 2022-08-27 ENCOUNTER — Other Ambulatory Visit: Payer: Self-pay | Admitting: Internal Medicine

## 2022-08-27 DIAGNOSIS — K219 Gastro-esophageal reflux disease without esophagitis: Secondary | ICD-10-CM

## 2022-08-29 NOTE — Telephone Encounter (Signed)
Requested Prescriptions  Pending Prescriptions Disp Refills   omeprazole (PRILOSEC) 40 MG capsule [Pharmacy Med Name: OMEPRAZOLE DR 40 MG CAPSULE] 90 capsule 3    Sig: TAKE 1 CAPSULE BY MOUTH DAILY     Gastroenterology: Proton Pump Inhibitors Passed - 08/27/2022  6:41 AM      Passed - Valid encounter within last 12 months    Recent Outpatient Visits           4 weeks ago Gastroesophageal reflux disease, unspecified whether esophagitis present   [facility] at [facility], Nyoka Cowden, MD   3 months ago Essential (primary) hypertension   [place] [facility] at [facility], Nyoka Cowden, MD   11 months ago Compression fracture of L1 vertebra with delayed healing, subsequent encounter   [facility] at [facility], Nyoka Cowden, MD   1 year ago Essential (primary) hypertension   [place] [facility] at [facility], Nyoka Cowden, MD   1 year ago Acute midline low back pain without sciatica   [facility] at [facility], Nyoka Cowden, MD       Future Appointments             In 8 months Judithann Graves, Nyoka Cowden, MD [facility] Medicine at [facility], [facility]

## 2022-09-07 ENCOUNTER — Encounter: Payer: Self-pay | Admitting: Internal Medicine

## 2022-09-08 ENCOUNTER — Encounter: Payer: Self-pay | Admitting: Internal Medicine

## 2022-09-08 ENCOUNTER — Ambulatory Visit (INDEPENDENT_AMBULATORY_CARE_PROVIDER_SITE_OTHER): Payer: Medicare Other | Admitting: Internal Medicine

## 2022-09-08 VITALS — BP 128/80 | HR 42 | Ht 64.0 in | Wt 154.0 lb

## 2022-09-08 DIAGNOSIS — I10 Essential (primary) hypertension: Secondary | ICD-10-CM

## 2022-09-08 DIAGNOSIS — E039 Hypothyroidism, unspecified: Secondary | ICD-10-CM | POA: Diagnosis not present

## 2022-09-08 DIAGNOSIS — M4696 Unspecified inflammatory spondylopathy, lumbar region: Secondary | ICD-10-CM

## 2022-09-08 NOTE — Progress Notes (Signed)
Date:  09/08/2022   Name:  Belinda Sanchez   DOB:  15-Jul-1940   MRN:  [id number]   Chief Complaint: Hypertension and Hypothyroidism  Hypertension This is a chronic problem. The problem is controlled. Pertinent negatives include no chest pain, headaches, palpitations or shortness of breath. Past treatments include angiotensin blockers, calcium channel blockers and beta blockers. Identifiable causes of hypertension include a thyroid problem.  Thyroid Problem Presents for follow-up visit. Patient reports no anxiety, constipation, diarrhea, fatigue or palpitations.  Back Pain This is a chronic problem. The problem occurs constantly. The problem is unchanged. The pain is present in the lumbar spine. The pain is moderate. Pertinent negatives include no abdominal pain, chest pain, headaches or weakness. Treatments tried: seeing [facility] for treatment options.    Lab Results  Component Value Date   NA 141 05/05/2022   K 4.4 05/05/2022   CO2 23 05/05/2022   GLUCOSE 96 05/05/2022   BUN 20 05/05/2022   CREATININE 0.91 05/05/2022   CALCIUM 10.2 05/05/2022   EGFR 64 05/05/2022   GFRNONAA 84 04/09/2020   Lab Results  Component Value Date   CHOL 261 (H) 05/05/2022   HDL 109 05/05/2022   LDLCALC 134 (H) 05/05/2022   TRIG 110 05/05/2022   CHOLHDL 2.4 05/05/2022   Lab Results  Component Value Date   TSH 10.500 (H) 05/05/2022   Lab Results  Component Value Date   HGBA1C 5.3 04/30/2021   Lab Results  Component Value Date   WBC 7.5 05/05/2022   HGB 14.8 05/05/2022   HCT 44.0 05/05/2022   MCV 99 (H) 05/05/2022   PLT 192 05/05/2022   Lab Results  Component Value Date   ALT 22 05/05/2022   AST 28 05/05/2022   ALKPHOS 74 05/05/2022   BILITOT 0.7 05/05/2022   Lab Results  Component Value Date   VD25OH 63.4 03/19/2019     Review of Systems  Constitutional:  Negative for fatigue and unexpected weight change.  HENT:  Negative for nosebleeds.   Eyes:  Negative for visual  disturbance.  Respiratory:  Negative for cough, chest tightness, shortness of breath and wheezing.   Cardiovascular:  Negative for chest pain, palpitations and leg swelling.  Gastrointestinal:  Negative for abdominal pain, constipation and diarrhea.  Musculoskeletal:  Positive for arthralgias, back pain and gait problem.  Neurological:  Negative for dizziness, weakness, light-headedness and headaches.  Psychiatric/Behavioral:  Negative for dysphoric mood and sleep disturbance. The patient is not nervous/anxious.     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   Acne erythematosa 12/03/2014   Acquired hypothyroidism 12/03/2014   Crohn's colitis, without complications (HCC) 12/03/2014   Dyslipidemia 12/03/2014   Essential (primary) hypertension 12/03/2014   Idiopathic insomnia 12/03/2014   Osteoarthritis of right knee 12/03/2014   Avitaminosis D 12/03/2014   History of breast cancer 06/27/2014    Allergies  Allergen Reactions   Clindamycin Diarrhea and Other (See Comments)    Abdominal pain   Tizanidine Other (See Comments)    Dizziness, dry mouth   Lozol  [Indapamide]     elevated serum  calcium   Sulfa Antibiotics Other (See Comments)    Reports excess bleeding. Advised not to take it by GI per patient Reports excess bleeding. Advised not to take it by GI per patient    Loratadine Palpitations    Past Surgical History:  Procedure Laterality Date   APPENDECTOMY     BREAST EXCISIONAL BIOPSY Right 04/2014   benign   COLONOSCOPY  08/2010   one polyp - repeat 5  yrs   EYE SURGERY Right 04/02/2019   cataract    MASTECTOMY, RADICAL Left 2009   SHOULDER SURGERY  2016   AVN   TOTAL SHOULDER REPLACEMENT Right 09/2019   VAGINAL HYSTERECTOMY     total    Social History   Tobacco Use   Smoking status: Never   Smokeless tobacco: Never  Vaping Use   Vaping status: Never Used  Substance Use Topics   Alcohol use: Yes    Alcohol/week: 16.0 standard drinks of alcohol    Types: 2 Standard drinks or equivalent, 14 Glasses of wine per week   Drug use: No     Medication list has been reviewed and updated.  Current Meds  Medication Sig   alendronate (FOSAMAX) 35 MG tablet Take 35 mg by mouth once a week.   folic acid (FOLVITE) 1 MG tablet Take 1 mg by mouth daily.   hydroxychloroquine (PLAQUENIL) 200 MG tablet Take by mouth.   inFLIXimab (REMICADE) 100 MG injection Inject into the vein.   levothyroxine (SYNTHROID) 75 MCG tablet TAKE 1 TABLET BY MOUTH DAILY   losartan (COZAAR) 50 MG tablet TAKE ONE TABLET BY MOUTH DAILY   methotrexate (RHEUMATREX) 2.5 MG tablet Take by mouth once a week. 4 tablets once a week   nebivolol (BYSTOLIC) 10 MG tablet Take 1 tablet (10 mg total) by mouth daily.   omeprazole (PRILOSEC) 40 MG capsule TAKE 1 CAPSULE BY MOUTH DAILY   predniSONE (DELTASONE) 5 MG tablet Take 2.5 mg by mouth daily.   TIADYLT ER 240 MG 24 hr capsule TAKE 1 CAPSULE BY MOUTH EVERY DAY   valACYclovir (VALTREX) 1000 MG tablet TAKE ONE TABLET BY MOUTH TWICE A DAY   VITAMIN D PO Take by mouth.   zolpidem (AMBIEN CR) 12.5 MG CR tablet Take 1 tablet (12.5 mg total) by mouth at bedtime.   [DISCONTINUED] HYDROcodone-acetaminophen (NORCO/VICODIN) 5-325 MG tablet Take 1 tablet by mouth every 6 (six) hours as needed.       09/08/2022   11:00 AM 08/01/2022   11:32 AM 05/05/2022   10:16 AM 09/30/2021   11:21 AM  GAD 7 : Generalized Anxiety Score  Nervous, Anxious, on Edge 2 0 1 1  Control/stop worrying 2 0 0 1  Worry too much - different things 2 0 0 1   Trouble relaxing 0 0 0 0  Restless 0 1 0 0  Easily annoyed or irritable 0 0 0 0  Afraid - awful might happen 0 0 1 1  Total GAD 7 Score 6 1 2 4   Anxiety Difficulty Not difficult at all Not difficult at all Not difficult at all Not difficult at all       09/08/2022   11:00 AM 08/01/2022   11:32 AM 05/05/2022   10:16 AM  Depression screen PHQ 2/9  Decreased Interest 0 0 1  Down, Depressed, Hopeless 0 0   PHQ - 2 Score 0 0 1  Altered sleeping 3 3 3   Tired, decreased energy 3 0 1  Change in  appetite 0 0 1  Feeling bad or failure about yourself  0 0 0  Trouble concentrating 0 0 0  Moving slowly or fidgety/restless 0 0 0  Suicidal thoughts 0 0 0  PHQ-9 Score 6 3 6   Difficult doing work/chores Not difficult at all Not difficult at all Not difficult at all    BP Readings from Last 3 Encounters:  09/08/22 128/80  08/01/22 110/70  05/05/22 130/76    Physical Exam Vitals and nursing note reviewed.  Constitutional:      General: She is not in acute distress.    Appearance: She is well-developed.  HENT:     Head: Normocephalic and atraumatic.  Neck:     Vascular: No carotid bruit.  Cardiovascular:     Rate and Rhythm: Normal rate and regular rhythm.     Heart sounds: No murmur heard. Pulmonary:     Effort: Pulmonary effort is normal. No respiratory distress.  Musculoskeletal:     Lumbar back: Decreased range of motion.     Right lower leg: No edema.     Left lower leg: No edema.  Lymphadenopathy:     Cervical: No cervical adenopathy.  Skin:    General: Skin is warm and dry.     Findings: No rash.  Neurological:     General: No focal deficit present.     Mental Status: She is alert and oriented to person, place, and time.  Psychiatric:        Mood and Affect: Mood normal.        Behavior: Behavior normal.     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 No concerns or side effects to current medication. No change in regimen; continue low sodium diet.       Acquired hypothyroidism - Primary (Chronic)    She has been compliant with daily medications. Feeling more sluggish and thinks she gained some weight although our scales are unchanged She needs labs rechecked.      Relevant Orders   TSH+T4F+T3Free    No follow-ups on file.    Reubin Milan, MD [facility] Primary Care and Sports Medicine [place]

## 2022-09-08 NOTE — Assessment & Plan Note (Signed)
She has been compliant with daily medications. Feeling more sluggish and thinks she gained some weight although our scales are unchanged She needs labs rechecked.

## 2022-09-08 NOTE — Assessment & Plan Note (Signed)
Normal exam with stable BP on bystolic, losartan and diltiazem. No concerns or side effects to current medication. No change in regimen; continue low sodium diet.

## 2022-09-08 NOTE — Assessment & Plan Note (Signed)
Seeing the pain clinic at [facility] Failed the trial ablation procedure Now considering Intracept procedure

## 2022-09-09 ENCOUNTER — Encounter: Payer: Self-pay | Admitting: Internal Medicine

## 2022-09-09 ENCOUNTER — Other Ambulatory Visit: Payer: Self-pay | Admitting: Internal Medicine

## 2022-09-09 DIAGNOSIS — E039 Hypothyroidism, unspecified: Secondary | ICD-10-CM

## 2022-09-09 MED ORDER — LEVOTHYROXINE SODIUM 88 MCG PO TABS: 88.0000 ug | ORAL_TABLET | Freq: Every day | ORAL | 3 refills | Status: DC

## 2022-10-16 ENCOUNTER — Other Ambulatory Visit: Payer: Self-pay | Admitting: Internal Medicine

## 2022-10-29 ENCOUNTER — Other Ambulatory Visit: Payer: Self-pay | Admitting: Internal Medicine

## 2022-10-29 DIAGNOSIS — F5101 Primary insomnia: Secondary | ICD-10-CM

## 2022-11-02 ENCOUNTER — Encounter: Payer: Self-pay | Admitting: Internal Medicine

## 2022-11-02 ENCOUNTER — Other Ambulatory Visit: Payer: Self-pay | Admitting: Internal Medicine

## 2022-11-02 DIAGNOSIS — F5101 Primary insomnia: Secondary | ICD-10-CM

## 2022-11-02 MED ORDER — ZOLPIDEM TARTRATE ER 6.25 MG PO TBCR: 12.5000 mg | EXTENDED_RELEASE_TABLET | Freq: Every evening | ORAL | 0 refills | Status: DC | PRN

## 2022-12-12 ENCOUNTER — Ambulatory Visit (INDEPENDENT_AMBULATORY_CARE_PROVIDER_SITE_OTHER): Payer: Medicare Other | Admitting: Internal Medicine

## 2022-12-12 ENCOUNTER — Encounter: Payer: Self-pay | Admitting: Internal Medicine

## 2022-12-12 VITALS — BP 122/60 | HR 79 | Ht 64.0 in | Wt 149.2 lb

## 2022-12-12 DIAGNOSIS — F5101 Primary insomnia: Secondary | ICD-10-CM

## 2022-12-12 DIAGNOSIS — K5732 Diverticulitis of large intestine without perforation or abscess without bleeding: Secondary | ICD-10-CM | POA: Diagnosis not present

## 2022-12-12 MED ORDER — AMOXICILLIN-POT CLAVULANATE 875-125 MG PO TABS: 1.0000 | ORAL_TABLET | Freq: Two times a day (BID) | ORAL | 0 refills | Status: AC

## 2022-12-12 MED ORDER — ZOLPIDEM TARTRATE ER 6.25 MG PO TBCR
12.5000 mg | EXTENDED_RELEASE_TABLET | Freq: Every evening | ORAL | 5 refills | Status: DC | PRN
Start: 2022-12-12 — End: 2023-05-08

## 2022-12-12 NOTE — Assessment & Plan Note (Signed)
Taking ambien 6.25 CR - 2 tabs nightly since the 12.5 mg dose is not available.

## 2022-12-12 NOTE — Progress Notes (Signed)
Date:  12/12/2022   Name:  Belinda Sanchez   DOB:  14-Jun-1940   MRN:  [id number]   Chief Complaint: Abdominal Pain (Patient concerned she has diverticulitis again. Patient seen UC on 10/13. They gave her medication and she only took them for 3 days. )  Abdominal Pain This is a recurrent problem. The problem has been gradually worsening. The pain is located in the LLQ. The pain is mild. Pertinent negatives include no constipation, diarrhea or fever.  Insomnia Primary symptoms: sleep disturbance.   The problem occurs nightly. The problem is unchanged.    Review of Systems  Constitutional:  Negative for chills, fatigue and fever.  Respiratory:  Negative for chest tightness, shortness of breath and wheezing.   Cardiovascular:  Negative for chest pain.  Gastrointestinal:  Positive for abdominal pain. Negative for blood in stool, constipation and diarrhea.  Psychiatric/Behavioral:  Positive for sleep disturbance. Negative for dysphoric mood. The patient has insomnia. The patient is not nervous/anxious.      Lab Results  Component Value Date   NA 141 05/05/2022   K 4.4 05/05/2022   CO2 23 05/05/2022   GLUCOSE 96 05/05/2022   BUN 20 05/05/2022   CREATININE 0.91 05/05/2022   CALCIUM 10.2 05/05/2022   EGFR 64 05/05/2022   GFRNONAA 84 04/09/2020   Lab Results  Component Value Date   CHOL 261 (H) 05/05/2022   HDL 109 05/05/2022   LDLCALC 134 (H) 05/05/2022   TRIG 110 05/05/2022   CHOLHDL 2.4 05/05/2022   Lab Results  Component Value Date   TSH 9.820 (H) 09/08/2022   Lab Results  Component Value Date   HGBA1C 5.3 04/30/2021   Lab Results  Component Value Date   WBC 7.5 05/05/2022   HGB 14.8 05/05/2022   HCT 44.0 05/05/2022   MCV 99 (H) 05/05/2022   PLT 192 05/05/2022   Lab Results  Component Value Date   ALT 22 05/05/2022   AST 28 05/05/2022   ALKPHOS 74 05/05/2022   BILITOT 0.7 05/05/2022   Lab Results  Component Value Date   VD25OH 63.4 03/19/2019      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   Acne erythematosa 12/03/2014   Acquired hypothyroidism 12/03/2014   Crohn's colitis, without complications (HCC) 12/03/2014   Dyslipidemia 12/03/2014   Essential (primary) hypertension 12/03/2014   Idiopathic insomnia 12/03/2014   Osteoarthritis of right knee 12/03/2014   Avitaminosis D 12/03/2014   History of breast cancer 06/27/2014    Allergies  Allergen Reactions   Clindamycin Diarrhea and Other (See Comments)    Abdominal pain   Tizanidine Other (See Comments)    Dizziness, dry mouth   Lozol  [Indapamide]     elevated serum calcium   Sulfa Antibiotics Other (See Comments)    Reports excess bleeding. Advised not to take it by GI per patient Reports excess bleeding. Advised not to take it by GI per patient    Loratadine Palpitations    Past Surgical History:  Procedure Laterality Date   APPENDECTOMY     BREAST EXCISIONAL BIOPSY Right 04/2014  benign   COLONOSCOPY  08/2010   one polyp - repeat 5 yrs   EYE SURGERY Right 04/02/2019   cataract    MASTECTOMY, RADICAL Left 2009   SHOULDER SURGERY  2016   AVN   TOTAL SHOULDER REPLACEMENT Right 09/2019   VAGINAL HYSTERECTOMY     total    Social History   Tobacco Use   Smoking status: Never   Smokeless tobacco: Never  Vaping Use   Vaping status: Never Used  Substance Use Topics   Alcohol use: Yes    Alcohol/week: 16.0 standard drinks of alcohol    Types: 2 Standard drinks or equivalent, 14 Glasses of wine per week   Drug use: No     Medication  list has been reviewed and updated.  Current Meds  Medication Sig   alendronate (FOSAMAX) 35 MG tablet Take 35 mg by mouth once a week.   amoxicillin-clavulanate (AUGMENTIN) 875-125 MG tablet Take 1 tablet by mouth 2 (two) times daily for 10 days.   diltiazem (TIADYLT ER) 240 MG 24 hr capsule TAKE 1 CAPSULE BY MOUTH EVERY DAY   folic acid (FOLVITE) 1 MG tablet Take 1 mg by mouth daily.   hydroxychloroquine (PLAQUENIL) 200 MG tablet Take by mouth.   inFLIXimab (REMICADE) 100 MG injection Inject into the vein.   levothyroxine (SYNTHROID) 88 MCG tablet Take 1 tablet (88 mcg total) by mouth daily.   losartan (COZAAR) 50 MG tablet TAKE ONE TABLET BY MOUTH DAILY   methotrexate (RHEUMATREX) 2.5 MG tablet Take by mouth once a week. 4 tablets once a week   nebivolol (BYSTOLIC) 10 MG tablet Take 1 tablet (10 mg total) by mouth daily.   omeprazole (PRILOSEC) 40 MG capsule TAKE 1 CAPSULE BY MOUTH DAILY   predniSONE (DELTASONE) 5 MG tablet Take 2.5 mg by mouth daily.   valACYclovir (VALTREX) 1000 MG tablet TAKE ONE TABLET BY MOUTH TWICE A DAY   VITAMIN D PO Take by mouth.   [DISCONTINUED] zolpidem (AMBIEN CR) 6.25 MG CR tablet Take 2 tablets (12.5 mg total) by mouth at bedtime as needed for sleep.       12/12/2022    9:34 AM 09/08/2022   11:00 AM 08/01/2022   11:32 AM 05/05/2022   10:16 AM  GAD 7 : Generalized Anxiety Score  Nervous, Anxious, on Edge 0 2 0 1  Control/stop worrying 0 2 0 0  Worry too much - different things 0 2 0 0  Trouble relaxing 0 0 0 0  Restless 0 0 1 0  Easily annoyed or irritable 0 0 0 0  Afraid - awful might happen 0 0 0 1  Total GAD 7 Score 0 6 1 2   Anxiety Difficulty Not difficult at all Not difficult at all Not difficult at all Not difficult at all       12/12/2022    9:33 AM 09/08/2022   11:00 AM 08/01/2022   11:32 AM  Depression screen PHQ 2/9  Decreased Interest 0 0 0  Down, Depressed, Hopeless 0 0 0  PHQ - 2 Score 0 0 0  Altered sleeping 1 3 3   Tired,  decreased energy 0 3 0  Change in appetite 1 0 0  Feeling bad or failure about yourself  0 0 0  Trouble concentrating 0 0 0  Moving slowly or fidgety/restless 0 0 0  Suicidal thoughts 0 0 0  PHQ-9 Score 2 6 3   Difficult doing work/chores Not difficult at all Not difficult at all  Not difficult at all    BP Readings from Last 3 Encounters:  12/12/22 122/60  09/08/22 128/80  08/01/22 110/70    Physical Exam Vitals and nursing note reviewed.  Constitutional:      General: She is not in acute distress.    Appearance: She is well-developed. She is not ill-appearing.  HENT:     Head: Normocephalic and atraumatic.  Cardiovascular:     Rate and Rhythm: Normal rate and regular rhythm.  Pulmonary:     Effort: Pulmonary effort is normal. No respiratory distress.     Breath sounds: Normal breath sounds.  Abdominal:     General: Bowel sounds are decreased.     Palpations: Abdomen is soft. There is no fluid wave or hepatomegaly.     Tenderness: There is abdominal tenderness in the left lower quadrant.  Skin:    General: Skin is warm and dry.     Findings: No rash.  Neurological:     Mental Status: She is alert and oriented to person, place, and time.  Psychiatric:        Mood and Affect: Mood normal.        Behavior: Behavior normal.     Wt Readings from Last 3 Encounters:  12/12/22 149 lb 3.2 oz (67.7 kg)  09/08/22 154 lb (69.9 kg)  08/01/22 156 lb (70.8 kg)    BP 122/60   Pulse 79   Ht 5\' 4"  (1.626 m)   Wt 149 lb 3.2 oz (67.7 kg)   SpO2 97%   BMI 25.61 kg/m   Assessment and Plan:  Problem List Items Addressed This Visit       Unprioritized   Idiopathic insomnia (Chronic)    Taking ambien 6.25 CR - 2 tabs nightly since the 12.5 mg dose is not available.      Relevant Medications   zolpidem (AMBIEN CR) 6.25 MG CR tablet   Other Visit Diagnoses     Diverticulitis large intestine w/o perforation or abscess w/o bleeding    -  Primary   complete full course of  Augmentin follow up if worsening or no improvement   Relevant Medications   amoxicillin-clavulanate (AUGMENTIN) 875-125 MG tablet       No follow-ups on file.    Reubin Milan, MD [facility] [place]

## 2023-01-10 ENCOUNTER — Other Ambulatory Visit: Payer: Self-pay | Admitting: Internal Medicine

## 2023-01-10 DIAGNOSIS — E039 Hypothyroidism, unspecified: Secondary | ICD-10-CM

## 2023-01-10 NOTE — Telephone Encounter (Signed)
Requested Prescriptions  Refused Prescriptions Disp Refills   levothyroxine (SYNTHROID) 75 MCG tablet [Pharmacy Med Name: LEVOTHYROXINE 75 MCG TABLET] 90 tablet 1    Sig: TAKE 1 TABLET BY MOUTH DAILY     Endocrinology:  Hypothyroid Agents Failed - 01/10/2023  8:28 AM      Failed - TSH in normal range and within 360 days    TSH  Date Value Ref Range Status  09/08/2022 9.820 (H) 0.450 - 4.500 uIU/mL Final         Passed - Valid encounter within last 12 months    Recent Outpatient Visits           4 weeks ago Diverticulitis large intestine w/o perforation or abscess w/o bleeding   [place] [facility] at [facility], Nyoka Cowden, MD   4 months ago Acquired hypothyroidism   [place] [facility] at [facility], Nyoka Cowden, MD   5 months ago Gastroesophageal reflux disease, unspecified whether esophagitis present   [facility] at [facility], Nyoka Cowden, MD   8 months ago Essential (primary) hypertension   [place] [facility] at [facility], Nyoka Cowden, MD   1 year ago Compression fracture of L1 vertebra with delayed healing, subsequent encounter   [facility] at [facility], Nyoka Cowden, MD       Future Appointments             In 3 months Judithann Graves, Nyoka Cowden, MD [facility] at [facility], [facility]

## 2023-01-14 ENCOUNTER — Encounter: Payer: Self-pay | Admitting: Internal Medicine

## 2023-01-15 ENCOUNTER — Other Ambulatory Visit: Payer: Self-pay | Admitting: Internal Medicine

## 2023-01-15 DIAGNOSIS — E039 Hypothyroidism, unspecified: Secondary | ICD-10-CM

## 2023-01-15 MED ORDER — LEVOTHYROXINE SODIUM 88 MCG PO TABS: 88.0000 ug | ORAL_TABLET | Freq: Every day | ORAL | 3 refills | Status: DC

## 2023-01-17 ENCOUNTER — Other Ambulatory Visit: Payer: Self-pay | Admitting: Internal Medicine

## 2023-01-17 DIAGNOSIS — E039 Hypothyroidism, unspecified: Secondary | ICD-10-CM

## 2023-01-17 MED ORDER — LEVOTHYROXINE SODIUM 75 MCG PO TABS: ORAL_TABLET | ORAL | 3 refills | Status: DC

## 2023-01-17 NOTE — Telephone Encounter (Signed)
Please review.  KP

## 2023-01-19 NOTE — Telephone Encounter (Signed)
Refused Synthroid 75 mcg because the dosing was changed on 01/17/2023 to add 1/2 tablet so many days out of the week.   This does not include that change.

## 2023-02-07 ENCOUNTER — Other Ambulatory Visit: Payer: Self-pay | Admitting: Internal Medicine

## 2023-02-07 DIAGNOSIS — E039 Hypothyroidism, unspecified: Secondary | ICD-10-CM

## 2023-02-07 NOTE — Telephone Encounter (Signed)
Requested Prescriptions  Pending Prescriptions Disp Refills   levothyroxine (SYNTHROID) 75 MCG tablet [Pharmacy Med Name: LEVOTHYROXINE 75 MCG TABLET] 90 tablet 1    Sig: TAKE 1 TABLET BY MOUTH DAILY     Endocrinology:  Hypothyroid Agents Failed - 02/07/2023  3:28 PM      Failed - TSH in normal range and within 360 days    TSH  Date Value Ref Range Status  09/08/2022 9.820 (H) 0.450 - 4.500 uIU/mL Final         Passed - Valid encounter within last 12 months    Recent Outpatient Visits           1 month ago Diverticulitis large intestine w/o perforation or abscess w/o bleeding   [place] [facility] at [facility], Nyoka Cowden, MD   5 months ago Acquired hypothyroidism   [place] [facility] at [facility], Nyoka Cowden, MD   6 months ago Gastroesophageal reflux disease, unspecified whether esophagitis present   [facility] at [facility], Nyoka Cowden, MD   9 months ago Essential (primary) hypertension    Primary Care & Sports Medicine at [facility], Nyoka Cowden, MD   1 year ago Compression fracture of L1 vertebra with delayed healing, subsequent encounter   [facility] at [facility], Nyoka Cowden, MD       Future Appointments             In 3 months Judithann Graves, Nyoka Cowden, MD [facility] at [facility], [facility]

## 2023-03-02 ENCOUNTER — Ambulatory Visit: Payer: Self-pay

## 2023-03-02 NOTE — Telephone Encounter (Signed)
     Chief Complaint: Upper abdominal pain. Had endoscopy 02/22/23 and told to follow up with PCP. Symptoms: Above Frequency: Months Pertinent Negatives: Patient denies vomiting, diarrhea Disposition: [] ED /[] Urgent Care (no appt availability in office) / [x] Appointment(In office/virtual)/ []  [facility]/ [] Home Care/ [] Refused Recommended Disposition /[] [facility]/ []  Follow-up with PCP Additional Notes: Agrees with appointment.  Reason for Disposition  Abdominal pain is a chronic symptom (recurrent or ongoing AND present > 4 weeks)  Answer Assessment - Initial Assessment Questions 1. LOCATION: "Where does it hurt?"      Upper 2. RADIATION: "Does the pain shoot anywhere else?" (e.g., chest, back)     Back 3. ONSET: "When did the pain begin?" (e.g., minutes, hours or days ago)      Several months 4. SUDDEN: "Gradual or sudden onset?"     Gradual 5. PATTERN "Does the pain come and go, or is it constant?"    - If it comes and goes: "How long does it last?" "Do you have pain now?"     (Note: Comes and goes means the pain is intermittent. It goes away completely between bouts.)    - If constant: "Is it getting better, staying the same, or getting worse?"      (Note: Constant means the pain never goes away completely; most serious pain is constant and gets worse.)      Constant pressure 6. SEVERITY: "How bad is the pain?"  (e.g., Scale 1-10; mild, moderate, or severe)    - MILD (1-3): Doesn't interfere with normal activities, abdomen soft and not tender to touch.     - MODERATE (4-7): Interferes with normal activities or awakens from sleep, abdomen tender to touch.     - SEVERE (8-10): Excruciating pain, doubled over, unable to do any normal activities.       Now - 2 7. RECURRENT SYMPTOM: "Have you ever had this type of stomach pain before?" If Yes, ask: "When was the last time?" and "What happened that time?"      Yes 8. CAUSE: "What do you think is causing the  stomach pain?"     Gastritis  9. RELIEVING/AGGRAVATING FACTORS: "What makes it better or worse?" (e.g., antacids, bending or twisting motion, bowel movement)     Laying down 10. OTHER SYMPTOMS: "Do you have any other symptoms?" (e.g., back pain, diarrhea, fever, urination pain, vomiting)       No 11. PREGNANCY: "Is there any chance you are pregnant?" "When was your last menstrual period?"       No  Protocols used: Abdominal Pain - [facility]

## 2023-03-03 ENCOUNTER — Encounter: Payer: Self-pay | Admitting: Internal Medicine

## 2023-03-03 ENCOUNTER — Ambulatory Visit (INDEPENDENT_AMBULATORY_CARE_PROVIDER_SITE_OTHER): Payer: Medicare Other | Admitting: Internal Medicine

## 2023-03-03 VITALS — BP 122/68 | HR 48 | Ht 64.0 in | Wt 147.0 lb

## 2023-03-03 DIAGNOSIS — M0579 Rheumatoid arthritis with rheumatoid factor of multiple sites without organ or systems involvement: Secondary | ICD-10-CM | POA: Diagnosis not present

## 2023-03-03 DIAGNOSIS — K219 Gastro-esophageal reflux disease without esophagitis: Secondary | ICD-10-CM | POA: Diagnosis not present

## 2023-03-03 NOTE — Assessment & Plan Note (Signed)
She has persistent upper abdominal fullness/discomfort with some belching. EGD did not show any significant disease so GI had no suggestions She plans to stop methotrexate as a possible cause. Consider trial of alternative PPI as well

## 2023-03-03 NOTE — Assessment & Plan Note (Signed)
On Methotrexate, Plaquenil and Remicade. She sees Rheum next month and will discuss tapering and d/c'ing some medications which might be cause epigastric discomfort.

## 2023-03-03 NOTE — Progress Notes (Signed)
Date:  03/03/2023   Name:  Belinda Sanchez   DOB:  09-09-1940   MRN:  [id number]   Chief Complaint: Abdominal Pain (Follow up from Endoscopy on 02/21/2022 with [facility]. Pain in stomach is "constant" at this point. GI doctor did not suggest any medications at this time. )  Abdominal Pain This is a chronic problem. The problem occurs constantly. The pain is located in the epigastric region. The quality of the pain is cramping and a sensation of fullness. Associated symptoms include belching. Pertinent negatives include no constipation, diarrhea, fever, nausea or weight loss. The pain is relieved by Nothing. She has tried proton pump inhibitors for the symptoms. The treatment provided moderate (of reflux) relief.  EGD - gastritis, benign polyp and metaplasia without dysplasia or evidence of bleeding.  She continues on omeprazole 20 mg daily.  She is tapering off of methotrexate in case this is contributing.   Review of Systems  Constitutional:  Negative for chills, fatigue, fever and weight loss.  Respiratory:  Negative for chest tightness and shortness of breath.   Cardiovascular:  Negative for chest pain.  Gastrointestinal:  Positive for abdominal pain. Negative for constipation, diarrhea and nausea.  Neurological:  Negative for dizziness.  Psychiatric/Behavioral:  Negative for dysphoric mood and sleep disturbance. The patient is not nervous/anxious.      Lab Results  Component Value Date   NA 141 05/05/2022   K 4.4 05/05/2022   CO2 23 05/05/2022   GLUCOSE 96 05/05/2022   BUN 20 05/05/2022   CREATININE 0.91 05/05/2022   CALCIUM 10.2 05/05/2022   EGFR 64 05/05/2022   GFRNONAA 84 04/09/2020   Lab Results  Component Value Date   CHOL 261 (H) 05/05/2022   HDL 109 05/05/2022   LDLCALC 134 (H) 05/05/2022   TRIG 110 05/05/2022   CHOLHDL 2.4 05/05/2022   Lab Results  Component Value Date   TSH 9.820 (H) 09/08/2022   Lab Results  Component Value Date   HGBA1C 5.3  04/30/2021   Lab Results  Component Value Date   WBC 7.5 05/05/2022   HGB 14.8 05/05/2022   HCT 44.0 05/05/2022   MCV 99 (H) 05/05/2022   PLT 192 05/05/2022   Lab Results  Component Value Date   ALT 22 05/05/2022   AST 28 05/05/2022   ALKPHOS 74 05/05/2022   BILITOT 0.7 05/05/2022   Lab Results  Component Value Date   VD25OH 63.4 03/19/2019     Patient Active Problem List   Diagnosis Date Noted   GERD without esophagitis 08/01/2022   Compression fracture of L1 lumbar vertebra (HCC) 09/30/2021   Osteopenia 11/23/2020   Rheumatoid arthritis (HCC) 06/11/2020   Hepatic steatosis 08/24/2018   Elevated LFTs 08/21/2018   Rotator cuff tendonitis, right 08/20/2018   Arthritis of foot, left 12/20/2016   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   Herpes simplex infection 12/15/2014   Acne erythematosa 12/03/2014   Acquired hypothyroidism 12/03/2014   Crohn's colitis, without complications (HCC) 12/03/2014   Dyslipidemia 12/03/2014   Essential (primary) hypertension 12/03/2014   Idiopathic insomnia 12/03/2014   Osteoarthritis of right knee 12/03/2014   Avitaminosis D 12/03/2014   History of breast cancer 06/27/2014    Allergies  Allergen Reactions   Clindamycin Diarrhea and Other (See Comments)    Abdominal pain   Tizanidine Other (See Comments)    Dizziness, dry mouth   Lozol  [Indapamide]     elevated serum calcium   Sulfa Antibiotics  Other (See Comments)    Reports excess bleeding. Advised not to take it by GI per patient Reports excess bleeding. Advised not to take it by GI per patient    Loratadine Palpitations    Past Surgical History:  Procedure Laterality Date   APPENDECTOMY     BREAST EXCISIONAL BIOPSY Right 04/2014   benign   COLONOSCOPY  08/2010   one polyp - repeat 5 yrs   EYE SURGERY Right 04/02/2019   cataract    MASTECTOMY, RADICAL Left 2009   SHOULDER SURGERY  2016   AVN   TOTAL SHOULDER REPLACEMENT Right 09/2019   VAGINAL  HYSTERECTOMY     total    Social History   Tobacco Use   Smoking status: Never   Smokeless tobacco: Never  Vaping Use   Vaping status: Never Used  Substance Use Topics   Alcohol use: Yes    Alcohol/week: 16.0 standard drinks of alcohol    Types: 2 Standard drinks or equivalent, 14 Glasses of wine per week   Drug use: No     Medication list has been reviewed and updated.  Current Meds  Medication Sig   alendronate (FOSAMAX) 35 MG tablet Take 35 mg by mouth once a week.   diltiazem (TIADYLT ER) 240 MG 24 hr capsule TAKE 1 CAPSULE BY MOUTH EVERY DAY   folic acid (FOLVITE) 1 MG tablet Take 1 mg by mouth daily.   hydroxychloroquine (PLAQUENIL) 200 MG tablet Take by mouth.   inFLIXimab (REMICADE) 100 MG injection Inject into the vein.   levothyroxine (SYNTHROID) 75 MCG tablet TAKE 1 TABLET BY MOUTH DAILY   losartan (COZAAR) 50 MG tablet TAKE ONE TABLET BY MOUTH DAILY   methotrexate (RHEUMATREX) 2.5 MG tablet Take by mouth once a week. 4 tablets once a week   nebivolol (BYSTOLIC) 10 MG tablet Take 1 tablet (10 mg total) by mouth daily.   omeprazole (PRILOSEC) 40 MG capsule TAKE 1 CAPSULE BY MOUTH DAILY   predniSONE (DELTASONE) 5 MG tablet Take 2.5 mg by mouth daily.   valACYclovir (VALTREX) 1000 MG tablet TAKE ONE TABLET BY MOUTH TWICE A DAY   VITAMIN D PO Take by mouth.   zolpidem (AMBIEN CR) 6.25 MG CR tablet Take 2 tablets (12.5 mg total) by mouth at bedtime as needed for sleep.       03/03/2023   10:20 AM 12/12/2022    9:34 AM 09/08/2022   11:00 AM 08/01/2022   11:32 AM  GAD 7 : Generalized Anxiety Score  Nervous, Anxious, on Edge 3 0 2 0  Control/stop worrying 2 0 2 0  Worry too much - different things 0 0 2 0  Trouble relaxing 1 0 0 0  Restless 0 0 0 1  Easily annoyed or irritable 0 0 0 0  Afraid - awful might happen 0 0 0 0  Total GAD 7 Score 6 0 6 1  Anxiety Difficulty Not difficult at all Not difficult at all Not difficult at all Not difficult at all        03/03/2023   10:19 AM 12/12/2022    9:33 AM 09/08/2022   11:00 AM  Depression screen PHQ 2/9  Decreased Interest 0 0 0  Down, Depressed, Hopeless 0 0 0  PHQ - 2 Score 0 0 0  Altered sleeping 2 1 3   Tired, decreased energy 0 0 3  Change in appetite 2 1 0  Feeling bad or failure about yourself  0 0 0  Trouble  concentrating 0 0 0  Moving slowly or fidgety/restless 0 0 0  Suicidal thoughts 0 0 0  PHQ-9 Score 4 2 6   Difficult doing work/chores Not difficult at all Not difficult at all Not difficult at all    BP Readings from Last 3 Encounters:  03/03/23 122/68  12/12/22 122/60  09/08/22 128/80    Physical Exam Vitals and nursing note reviewed.  Constitutional:      General: She is not in acute distress.    Appearance: She is well-developed.  HENT:     Head: Normocephalic and atraumatic.  Pulmonary:     Effort: Pulmonary effort is normal. No respiratory distress.  Abdominal:     General: Abdomen is flat. There is no distension.     Palpations: Abdomen is soft.     Tenderness: There is abdominal tenderness in the epigastric area. There is no right CVA tenderness, left CVA tenderness, guarding or rebound.  Skin:    General: Skin is warm and dry.     Findings: No rash.  Neurological:     Mental Status: She is alert and oriented to person, place, and time.  Psychiatric:        Mood and Affect: Mood normal.        Behavior: Behavior normal.     Wt Readings from Last 3 Encounters:  03/03/23 147 lb (66.7 kg)  12/12/22 149 lb 3.2 oz (67.7 kg)  09/08/22 154 lb (69.9 kg)    BP 122/68   Pulse (!) 48   Ht 5\' 4"  (1.626 m)   Wt 147 lb (66.7 kg)   SpO2 97%   BMI 25.23 kg/m   Assessment and Plan:  Problem List Items Addressed This Visit       Unprioritized   Rheumatoid arthritis (HCC) (Chronic)   On Methotrexate, Plaquenil and Remicade. She sees Rheum next month and will discuss tapering and d/c'ing some medications which might be cause epigastric discomfort.       GERD without esophagitis - Primary   She has persistent upper abdominal fullness/discomfort with some belching. EGD did not show any significant disease so GI had no suggestions She plans to stop methotrexate as a possible cause. Consider trial of alternative PPI as well       No follow-ups on file.    Reubin Milan, MD [facility] [place]

## 2023-04-28 LAB — BASIC METABOLIC PANEL
BUN: 10 (ref 4–21)
CO2: 18 (ref 13–22)
Chloride: 99 (ref 99–108)
Creatinine: 0.8 (ref 0.5–1.1)
Glucose: 94
Potassium: 3.9 meq/L (ref 3.5–5.1)
Sodium: 135 — AB (ref 137–147)

## 2023-04-28 LAB — CBC AND DIFFERENTIAL
HCT: 42 (ref 36–46)
Hemoglobin: 14.8 (ref 12.0–16.0)
Platelets: 162 10*3/uL (ref 150–400)
WBC: 9.6

## 2023-04-28 LAB — HEPATIC FUNCTION PANEL
ALT: 15 U/L (ref 7–35)
AST: 18 (ref 13–35)
Alkaline Phosphatase: 70 (ref 25–125)

## 2023-04-28 LAB — COMPREHENSIVE METABOLIC PANEL: eGFR: 79

## 2023-04-28 LAB — TSH: TSH: 23.81 — AB (ref 0.41–5.90)

## 2023-05-08 ENCOUNTER — Encounter: Payer: Self-pay | Admitting: Internal Medicine

## 2023-05-08 ENCOUNTER — Other Ambulatory Visit: Payer: Self-pay | Admitting: Internal Medicine

## 2023-05-08 ENCOUNTER — Ambulatory Visit (INDEPENDENT_AMBULATORY_CARE_PROVIDER_SITE_OTHER): Payer: Medicare Other | Admitting: Internal Medicine

## 2023-05-08 VITALS — BP 112/64 | HR 57 | Ht 64.0 in | Wt 142.5 lb

## 2023-05-08 DIAGNOSIS — K5732 Diverticulitis of large intestine without perforation or abscess without bleeding: Secondary | ICD-10-CM

## 2023-05-08 DIAGNOSIS — F5101 Primary insomnia: Secondary | ICD-10-CM | POA: Diagnosis not present

## 2023-05-08 DIAGNOSIS — N390 Urinary tract infection, site not specified: Secondary | ICD-10-CM

## 2023-05-08 DIAGNOSIS — I10 Essential (primary) hypertension: Secondary | ICD-10-CM

## 2023-05-08 DIAGNOSIS — M0579 Rheumatoid arthritis with rheumatoid factor of multiple sites without organ or systems involvement: Secondary | ICD-10-CM

## 2023-05-08 DIAGNOSIS — E039 Hypothyroidism, unspecified: Secondary | ICD-10-CM

## 2023-05-08 DIAGNOSIS — Z853 Personal history of malignant neoplasm of breast: Secondary | ICD-10-CM

## 2023-05-08 DIAGNOSIS — E785 Hyperlipidemia, unspecified: Secondary | ICD-10-CM

## 2023-05-08 MED ORDER — NEBIVOLOL HCL 10 MG PO TABS: 10.0000 mg | ORAL_TABLET | Freq: Every day | ORAL | 3 refills | Status: AC

## 2023-05-08 MED ORDER — ZOLPIDEM TARTRATE ER 6.25 MG PO TBCR: 12.5000 mg | EXTENDED_RELEASE_TABLET | Freq: Every evening | ORAL | 5 refills | Status: DC | PRN

## 2023-05-08 NOTE — Assessment & Plan Note (Signed)
 Blood pressures is well controlled.  Current medications Bystolic, losartan and cardiazem. Will continue same regimen along with efforts to limit dietary sodium.

## 2023-05-08 NOTE — Assessment & Plan Note (Signed)
 Managed with diet only. Lab Results  Component Value Date   LDLCALC 134 (H) 05/05/2022

## 2023-05-08 NOTE — Patient Instructions (Signed)
Call [facility] to schedule your Mammogram at [phone].

## 2023-05-08 NOTE — Assessment & Plan Note (Signed)
 On Ambien nightly - no evidence of misuse or abuse.

## 2023-05-08 NOTE — Assessment & Plan Note (Signed)
 Supplemented.  Recent TSH slightly elevated but T3 low and T4 normal.

## 2023-05-08 NOTE — Progress Notes (Signed)
 Date:  05/08/2023   Name:  Belinda Sanchez   DOB:  [birth year]   MRN:  [id number]   Chief Complaint: Annual Exam Belinda Sanchez is a 83 y.o. female who presents today for her Complete Annual Exam. She feels fairly well. She reports exercising walk 2 - 3 times a week, 30 minutes She reports she is sleeping poorly. Breast complaints none.  Health Maintenance  Topic Date Due   Mammogram  12/07/2022   Medicare Annual Wellness Visit  05/05/2023   COVID-19 Vaccine (6 - 2024-25 season) 05/24/2023*   DTaP/Tdap/Td vaccine (2 - Td or Tdap) 05/28/2023   Pneumonia Vaccine  Completed   Flu Shot  Completed   Zoster (Shingles) Vaccine  Completed   DEXA scan (bone density measurement)  Addressed   HPV Vaccine  Aged Out   Colon Cancer Screening  Discontinued   Hepatitis C Screening  Discontinued  *Topic was postponed. The date shown is not the original due date.     Hypertension Pertinent negatives include no chest pain, headaches, palpitations or shortness of breath. Identifiable causes of hypertension include a thyroid problem.  Thyroid Problem Patient reports no constipation, diarrhea, fatigue or palpitations.  Insomnia Primary symptoms: difficulty falling asleep, frequent awakening.   The problem occurs nightly. The problem is unchanged.  UTI -  Pseudomonas UTI - rec Levaquin x 5 day. Diverticulitis -  treated with Augmentin - just finished yesterday.  Review of Systems  Constitutional:  Negative for fatigue and unexpected weight change.  HENT:  Negative for trouble swallowing.   Eyes:  Negative for visual disturbance.  Respiratory:  Negative for cough, chest tightness, shortness of breath and wheezing.   Cardiovascular:  Negative for chest pain, palpitations and leg swelling.  Gastrointestinal:  Negative for abdominal pain, constipation and diarrhea.  Musculoskeletal:  Negative for arthralgias and myalgias.  Neurological:  Negative for dizziness, weakness, light-headedness and  headaches.  Psychiatric/Behavioral:  The patient has insomnia.      Lab Results  Component Value Date   NA 135 (A) 04/28/2023   K 3.9 04/28/2023   CO2 18 04/28/2023   GLUCOSE 96 05/05/2022   BUN 10 04/28/2023   CREATININE 0.8 04/28/2023   CALCIUM 10.2 05/05/2022   EGFR 79 04/28/2023   GFRNONAA 84 04/09/2020   Lab Results  Component Value Date   CHOL 261 (H) 05/05/2022   HDL 109 05/05/2022   LDLCALC 134 (H) 05/05/2022   TRIG 110 05/05/2022   CHOLHDL 2.4 05/05/2022   Lab Results  Component Value Date   TSH 23.81 (A) 04/28/2023   Lab Results  Component Value Date   HGBA1C 5.3 04/30/2021   Lab Results  Component Value Date   WBC 9.6 04/28/2023   HGB 14.8 04/28/2023   HCT 42 04/28/2023   MCV 99 (H) 05/05/2022   PLT 162 04/28/2023   Lab Results  Component Value Date   ALT 15 04/28/2023   AST 18 04/28/2023   ALKPHOS 70 04/28/2023   BILITOT 0.7 05/05/2022   Lab Results  Component Value Date   VD25OH 63.4 03/19/2019     Patient Active Problem List   Diagnosis Date Noted   GERD without esophagitis 08/01/2022   Compression fracture of L1 lumbar vertebra (HCC) 09/30/2021   Osteopenia 11/23/2020   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) 06/11/2020   Hepatic steatosis 08/24/2018   Elevated LFTs 08/21/2018   Rotator cuff tendonitis, right 08/20/2018   Arthritis  of foot, left 12/20/2016   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   Herpes simplex infection 12/15/2014   Acquired hypothyroidism 12/03/2014   Crohn's colitis, without complications (HCC) 12/03/2014   Dyslipidemia 12/03/2014   Essential (primary) hypertension 12/03/2014   Idiopathic insomnia 12/03/2014   Osteoarthritis of right knee 12/03/2014   Avitaminosis D 12/03/2014   History of breast cancer 06/27/2014    Allergies  Allergen Reactions   Clindamycin Diarrhea and Other (See Comments)    Abdominal pain    Tizanidine Other (See Comments)    Dizziness, dry mouth   Lozol  [Indapamide]     elevated serum calcium   Sulfa Antibiotics Other (See Comments)    Reports excess bleeding. Advised not to take it by GI per patient Reports excess bleeding. Advised not to take it by GI per patient    Loratadine Palpitations    Past Surgical History:  Procedure Laterality Date   APPENDECTOMY     BREAST EXCISIONAL BIOPSY Right 04/2014   benign   COLONOSCOPY  08/2010   one polyp - repeat 5 yrs   EYE SURGERY Right 04/02/2019   cataract    MASTECTOMY, RADICAL Left 2009   SHOULDER SURGERY  2016   AVN   TOTAL SHOULDER REPLACEMENT Right 09/2019   VAGINAL HYSTERECTOMY     total    Social History   Tobacco Use   Smoking status: Never   Smokeless tobacco: Never  Vaping Use   Vaping status: Never Used  Substance Use Topics   Alcohol use: Yes    Alcohol/week: 16.0 standard drinks of alcohol    Types: 2 Standard drinks or equivalent, 14 Glasses of wine per week   Drug use: No     Medication list has been reviewed and updated.  Current Meds  Medication Sig   alendronate (FOSAMAX) 35 MG tablet Take 35 mg by mouth once a week.   diltiazem (TIADYLT ER) 240 MG 24 hr capsule TAKE 1 CAPSULE BY MOUTH EVERY DAY   fexofenadine (ALLEGRA) 180 MG tablet Take by mouth.   fluticasone (FLONASE) 50 MCG/ACT nasal spray Place into the nose.   folic acid (FOLVITE) 1 MG tablet Take 1 mg by mouth daily.   HYDROcodone-acetaminophen (NORCO/VICODIN) 5-325 MG tablet Take by mouth.   hydroxychloroquine (PLAQUENIL) 200 MG tablet Take by mouth.   inFLIXimab (REMICADE) 100 MG injection Inject into the vein.   levofloxacin (LEVAQUIN) 750 MG tablet Take 750 mg by mouth daily.   levothyroxine (SYNTHROID) 75 MCG tablet TAKE 1 TABLET BY MOUTH DAILY   losartan (COZAAR) 50 MG tablet TAKE ONE TABLET BY MOUTH DAILY   metroNIDAZOLE (FLAGYL) 500 MG tablet Take 500 mg by mouth 2 (two) times daily.   omeprazole (PRILOSEC) 40 MG  capsule TAKE 1 CAPSULE BY MOUTH DAILY   predniSONE (DELTASONE) 5 MG tablet Take 2.5 mg by mouth daily.   valACYclovir (VALTREX) 1000 MG tablet TAKE ONE TABLET BY MOUTH TWICE A DAY   VITAMIN D PO Take by mouth.   [DISCONTINUED] nebivolol (BYSTOLIC) 10 MG tablet Take 1 tablet (10 mg total) by mouth daily.   [DISCONTINUED] zolpidem (AMBIEN CR) 6.25 MG CR tablet Take 2 tablets (12.5 mg total) by mouth at bedtime as needed for sleep.       05/08/2023    9:57 AM 03/03/2023   10:20 AM 12/12/2022    9:34 AM 09/08/2022   11:00 AM  GAD 7 : Generalized Anxiety Score  Nervous, Anxious, on Edge 0 3 0  2  Control/stop worrying 1 2 0 2  Worry too much - different things 0 0 0 2  Trouble relaxing 0 1 0 0  Restless 0 0 0 0  Easily annoyed or irritable 0 0 0 0  Afraid - awful might happen 1 0 0 0  Total GAD 7 Score 2 6 0 6  Anxiety Difficulty Not difficult at all Not difficult at all Not difficult at all Not difficult at all       05/08/2023    9:57 AM 03/03/2023   10:19 AM 12/12/2022    9:33 AM  Depression screen PHQ 2/9  Decreased Interest 1 0 0  Down, Depressed, Hopeless 0 0 0  PHQ - 2 Score 1 0 0  Altered sleeping 2 2 1   Tired, decreased energy 2 0 0  Change in appetite 0 2 1  Feeling bad or failure about yourself  0 0 0  Trouble concentrating 0 0 0  Moving slowly or fidgety/restless 1 0 0  Suicidal thoughts 0 0 0  PHQ-9 Score 6 4 2   Difficult doing work/chores Not difficult at all Not difficult at all Not difficult at all    BP Readings from Last 3 Encounters:  05/08/23 112/64  03/03/23 122/68  12/12/22 122/60    Physical Exam Vitals and nursing note reviewed.  Constitutional:      General: She is not in acute distress.    Appearance: She is well-developed.  HENT:     Head: Normocephalic and atraumatic.     Right Ear: Tympanic membrane and ear canal normal.     Left Ear: Tympanic membrane and ear canal normal.     Nose:     Right Sinus: No maxillary sinus tenderness.      Left Sinus: No maxillary sinus tenderness.  Eyes:     General: No scleral icterus.       Right eye: No discharge.        Left eye: No discharge.     Conjunctiva/sclera: Conjunctivae normal.  Neck:     Thyroid: No thyromegaly.     Vascular: No carotid bruit.  Cardiovascular:     Rate and Rhythm: Normal rate and regular rhythm.     Pulses: Normal pulses.     Heart sounds: Normal heart sounds.  Pulmonary:     Effort: Pulmonary effort is normal. No respiratory distress.     Breath sounds: No wheezing.  Abdominal:     General: Bowel sounds are normal.     Palpations: Abdomen is soft.     Tenderness: There is no abdominal tenderness.  Musculoskeletal:     Cervical back: Normal range of motion. No erythema.     Right lower leg: No edema.     Left lower leg: No edema.  Lymphadenopathy:     Cervical: No cervical adenopathy.  Skin:    General: Skin is warm and dry.     Findings: No rash.  Neurological:     Mental Status: She is alert and oriented to person, place, and time.     Cranial Nerves: No cranial nerve deficit.     Sensory: No sensory deficit.     Deep Tendon Reflexes: Reflexes are normal and symmetric.  Psychiatric:        Attention and Perception: Attention normal.        Mood and Affect: Mood normal.     Wt Readings from Last 3 Encounters:  05/08/23 142 lb 8 oz (64.6 kg)  03/03/23  147 lb (66.7 kg)  12/12/22 149 lb 3.2 oz (67.7 kg)    BP 112/64   Pulse (!) 57   Ht 5\' 4"  (1.626 m)   Wt 142 lb 8 oz (64.6 kg)   SpO2 97%   BMI 24.46 kg/m   Assessment and Plan:  Problem List Items Addressed This Visit       Unprioritized   Acquired hypothyroidism (Chronic)   Supplemented.  Recent TSH slightly elevated but T3 low and T4 normal.      Relevant Medications   nebivolol (BYSTOLIC) 10 MG tablet   Dyslipidemia (Chronic)   Managed with diet only. Lab Results  Component Value Date   LDLCALC 134 (H) 05/05/2022         Essential (primary) hypertension -  Primary (Chronic)   Blood pressures is well controlled.  Current medications Bystolic, losartan and cardiazem. Will continue same regimen along with efforts to limit dietary sodium.       Relevant Medications   nebivolol (BYSTOLIC) 10 MG tablet   Idiopathic insomnia (Chronic)   On Ambien nightly - no evidence of misuse or abuse.      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)   Relevant Medications   HYDROcodone-acetaminophen (NORCO/VICODIN) 5-325 MG tablet   Other Visit Diagnoses       UTI (urinary tract infection), uncomplicated       recommend completeing 5 days of Levaquin   Relevant Medications   metroNIDAZOLE (FLAGYL) 500 MG tablet     Diverticulitis large intestine w/o perforation or abscess w/o bleeding       recently seen at [facility] - treated outpatient with Augmentin   Relevant Medications   metroNIDAZOLE (FLAGYL) 500 MG tablet   levofloxacin (LEVAQUIN) 750 MG tablet   HYDROcodone-acetaminophen (NORCO/VICODIN) 5-325 MG tablet       Return in about 6 months (around 11/08/2023) for HTN.    Reubin Milan, MD [facility] [place]

## 2023-05-24 LAB — HM MAMMOGRAPHY

## 2023-07-06 ENCOUNTER — Other Ambulatory Visit: Payer: Self-pay | Admitting: Internal Medicine

## 2023-07-06 DIAGNOSIS — I10 Essential (primary) hypertension: Secondary | ICD-10-CM

## 2023-07-07 NOTE — Telephone Encounter (Signed)
 Requested Prescriptions  Pending Prescriptions Disp Refills   losartan  (COZAAR ) 50 MG tablet [Pharmacy Med Name: LOSARTAN  POTASSIUM 50 MG TAB] 90 tablet 0    Sig: TAKE 1 TABLET BY MOUTH DAILY     Cardiovascular:  Angiotensin Receptor Blockers Passed - 07/07/2023 10:57 AM      Passed - Cr in normal range and within 180 days    Creatinine  Date Value Ref Range Status  04/28/2023 0.8 0.5 - 1.1 Final   Creatinine, Ser  Date Value Ref Range Status  05/05/2022 0.91 0.57 - 1.00 mg/dL Final         Passed - K in normal range and within 180 days    Potassium  Date Value Ref Range Status  04/28/2023 3.9 3.5 - 5.1 mEq/L Final         Passed - Patient is not pregnant      Passed - Last BP in normal range    BP Readings from Last 1 Encounters:  05/08/23 112/64         Passed - Valid encounter within last 6 months    Recent Outpatient Visits           2 months ago Essential (primary) hypertension   [place] [facility] at [facility], [place], MD

## 2023-08-15 ENCOUNTER — Ambulatory Visit (INDEPENDENT_AMBULATORY_CARE_PROVIDER_SITE_OTHER): Admitting: Internal Medicine

## 2023-08-15 ENCOUNTER — Encounter: Payer: Self-pay | Admitting: Internal Medicine

## 2023-08-15 VITALS — BP 118/72 | HR 63 | Temp 98.1°F | Ht 64.0 in | Wt 147.0 lb

## 2023-08-15 DIAGNOSIS — K5733 Diverticulitis of large intestine without perforation or abscess with bleeding: Secondary | ICD-10-CM | POA: Diagnosis not present

## 2023-08-15 DIAGNOSIS — R1030 Lower abdominal pain, unspecified: Secondary | ICD-10-CM | POA: Diagnosis not present

## 2023-08-15 MED ORDER — AMOXICILLIN-POT CLAVULANATE 875-125 MG PO TABS: 1.0000 | ORAL_TABLET | Freq: Two times a day (BID) | ORAL | 0 refills | Status: AC

## 2023-08-15 MED ORDER — DICYCLOMINE HCL 10 MG PO CAPS
10.0000 mg | ORAL_CAPSULE | Freq: Three times a day (TID) | ORAL | 0 refills | Status: AC
Start: 2023-08-15 — End: ?

## 2023-08-15 NOTE — Progress Notes (Signed)
 Date:  08/15/2023   Name:  Belinda Sanchez   DOB:  1940-04-29   MRN:  [id number]   Chief Complaint: Abdominal Pain (X 2 weeks. Sharp pains. Pt has hx of diverticulitis. )  Abdominal Pain This is a recurrent problem. The current episode started 1 to 4 weeks ago. The onset quality is undetermined. The problem occurs constantly. The problem has been unchanged. Associated symptoms include constipation. Pertinent negatives include no diarrhea, fever, nausea or vomiting.    Review of Systems  Constitutional:  Negative for appetite change, chills, fatigue and fever.  Respiratory:  Negative for chest tightness and shortness of breath.   Cardiovascular:  Negative for chest pain and palpitations.  Gastrointestinal:  Positive for abdominal pain and constipation. Negative for diarrhea, nausea, rectal pain and vomiting.  Psychiatric/Behavioral:  Negative for dysphoric mood and sleep disturbance. The patient is not nervous/anxious.      Lab Results  Component Value Date   NA 135 (A) 04/28/2023   K 3.9 04/28/2023   CO2 18 04/28/2023   GLUCOSE 96 05/05/2022   BUN 10 04/28/2023   CREATININE 0.8 04/28/2023   CALCIUM 10.2 05/05/2022   EGFR 79 04/28/2023   GFRNONAA 84 04/09/2020   Lab Results  Component Value Date   CHOL 261 (H) 05/05/2022   HDL 109 05/05/2022   LDLCALC 134 (H) 05/05/2022   TRIG 110 05/05/2022   CHOLHDL 2.4 05/05/2022   Lab Results  Component Value Date   TSH 23.81 (A) 04/28/2023   Lab Results  Component Value Date   HGBA1C 5.3 04/30/2021   Lab Results  Component Value Date   WBC 9.6 04/28/2023   HGB 14.8 04/28/2023   HCT 42 04/28/2023   MCV 99 (H) 05/05/2022   PLT 162 04/28/2023   Lab Results  Component Value Date   ALT 15 04/28/2023   AST 18 04/28/2023   ALKPHOS 70 04/28/2023   BILITOT 0.7 05/05/2022   Lab Results  Component Value Date   VD25OH 63.4 03/19/2019     Patient Active Problem List   Diagnosis Date Noted   GERD without esophagitis  08/01/2022   Compression fracture of L1 lumbar vertebra (HCC) 09/30/2021   Osteopenia 11/23/2020   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) 06/11/2020   Hepatic steatosis 08/24/2018   Elevated LFTs 08/21/2018   Rotator cuff tendonitis, right 08/20/2018   Arthritis of foot, left 12/20/2016   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   Herpes simplex infection 12/15/2014   Acquired hypothyroidism 12/03/2014   Crohn's colitis, without complications (HCC) 12/03/2014   Dyslipidemia 12/03/2014   Essential (primary) hypertension 12/03/2014   Idiopathic insomnia 12/03/2014   Osteoarthritis of right knee 12/03/2014   Avitaminosis D 12/03/2014   History of breast cancer 06/27/2014    Allergies  Allergen Reactions   Clindamycin Diarrhea and Other (See Comments)    Abdominal pain   Tizanidine  Other (See Comments)    Dizziness, dry mouth   Lozol  [Indapamide]     elevated serum calcium   Sulfa Antibiotics Other (See Comments)    Reports excess bleeding. Advised not to take it by GI per patient Reports excess bleeding. Advised not to take it by GI per patient    Loratadine Palpitations    Past Surgical History:  Procedure Laterality Date   APPENDECTOMY     BREAST EXCISIONAL BIOPSY Right 04/2014   benign   BREAST SURGERY  COLONOSCOPY  08/2010   one polyp - repeat 5 yrs   EYE SURGERY Right 04/02/2019   cataract    JOINT REPLACEMENT  June 2021   MASTECTOMY, RADICAL Left 2009   SHOULDER SURGERY  2016   AVN   TOTAL SHOULDER REPLACEMENT Right 09/2019   VAGINAL HYSTERECTOMY     total    Social History   Tobacco Use   Smoking status: Never   Smokeless tobacco: Never  Vaping Use   Vaping status: Never Used  Substance Use Topics   Alcohol use: Yes    Alcohol/week: 16.0 standard drinks of alcohol    Types: 2 Standard drinks or equivalent, 14 Glasses of wine per week   Drug use: No      Medication list has been reviewed and updated.  Current Meds  Medication Sig   alendronate (FOSAMAX) 35 MG tablet Take 35 mg by mouth once a week.   amoxicillin -clavulanate (AUGMENTIN ) 875-125 MG tablet Take 1 tablet by mouth 2 (two) times daily for 10 days.   dicyclomine  (BENTYL ) 10 MG capsule Take 1 capsule (10 mg total) by mouth 4 (four) times daily -  before meals and at bedtime.   diltiazem  (TIADYLT  ER) 240 MG 24 hr capsule TAKE 1 CAPSULE BY MOUTH EVERY DAY   fluticasone (FLONASE) 50 MCG/ACT nasal spray Place into the nose.   HYDROcodone -acetaminophen  (NORCO/VICODIN) 5-325 MG tablet Take by mouth.   hydroxychloroquine (PLAQUENIL) 200 MG tablet Take by mouth.   inFLIXimab (REMICADE) 100 MG injection Inject into the vein.   levothyroxine  (SYNTHROID ) 75 MCG tablet TAKE 1 TABLET BY MOUTH DAILY   losartan  (COZAAR ) 50 MG tablet TAKE 1 TABLET BY MOUTH DAILY   nebivolol  (BYSTOLIC ) 10 MG tablet Take 1 tablet (10 mg total) by mouth daily.   omeprazole  (PRILOSEC) 40 MG capsule TAKE 1 CAPSULE BY MOUTH DAILY   predniSONE  (DELTASONE ) 5 MG tablet Take 2.5 mg by mouth daily.   valACYclovir  (VALTREX ) 1000 MG tablet TAKE ONE TABLET BY MOUTH TWICE A DAY   VITAMIN D  PO Take by mouth.   zolpidem  (AMBIEN  CR) 6.25 MG CR tablet Take 2 tablets (12.5 mg total) by mouth at bedtime as needed for sleep.       08/15/2023    8:52 AM 05/08/2023    9:57 AM 03/03/2023   10:20 AM 12/12/2022    9:34 AM  GAD 7 : Generalized Anxiety Score  Nervous, Anxious, on Edge 0 0 3 0  Control/stop worrying 0 1 2 0  Worry too much - different things 0 0 0 0  Trouble relaxing 0 0 1 0  Restless 0 0 0 0  Easily annoyed or irritable 0 0 0 0  Afraid - awful might happen 0 1 0 0  Total GAD 7 Score 0 2 6 0  Anxiety Difficulty Not difficult at all Not difficult at all Not difficult at all Not difficult at all       08/15/2023    8:52 AM 05/08/2023    9:57 AM 03/03/2023   10:19 AM  Depression screen PHQ 2/9  Decreased  Interest 0 1 0  Down, Depressed, Hopeless 0 0 0  PHQ - 2 Score 0 1 0  Altered sleeping 0 2 2  Tired, decreased energy 0 2 0  Change in appetite 0 0 2  Feeling bad or failure about yourself  0 0 0  Trouble concentrating 0 0 0  Moving slowly or fidgety/restless 0 1 0  Suicidal thoughts 0 0  0  PHQ-9 Score 0 6 4  Difficult doing work/chores Not difficult at all Not difficult at all Not difficult at all    BP Readings from Last 3 Encounters:  08/15/23 118/72  05/08/23 112/64  03/03/23 122/68    Physical Exam Vitals and nursing note reviewed.  Constitutional:      General: She is not in acute distress.    Appearance: She is well-developed. She is not ill-appearing.  HENT:     Head: Normocephalic and atraumatic.  Pulmonary:     Effort: Pulmonary effort is normal. No respiratory distress.  Abdominal:     General: Abdomen is flat. Bowel sounds are decreased.     Palpations: Abdomen is soft.     Tenderness: There is abdominal tenderness in the left upper quadrant and left lower quadrant. There is no right CVA tenderness, left CVA tenderness, guarding or rebound.   Skin:    General: Skin is warm and dry.     Findings: No rash.   Neurological:     Mental Status: She is alert and oriented to person, place, and time.   Psychiatric:        Mood and Affect: Mood normal.        Behavior: Behavior normal.     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    Leita HILARIO Adie, MD [facility] [place]

## 2023-08-19 ENCOUNTER — Other Ambulatory Visit: Payer: Self-pay | Admitting: Internal Medicine

## 2023-08-19 DIAGNOSIS — F5101 Primary insomnia: Secondary | ICD-10-CM

## 2023-08-21 ENCOUNTER — Encounter: Payer: Self-pay | Admitting: Internal Medicine

## 2023-08-22 NOTE — Telephone Encounter (Signed)
 Requested medication (s) are due for refill today -no  Requested medication (s) are on the active medication list -yes  Future visit scheduled -yes  Last refill: -05/08/23 #60 5RF  Notes to clinic: non delegated Rx  Requested Prescriptions  Pending Prescriptions Disp Refills   zolpidem  (AMBIEN  CR) 6.25 MG CR tablet [Pharmacy Med Name: ZOLPIDEM  TART ER 6.25 MG TAB] 60 tablet     Sig: TAKE 2 TABLETS BY MOUTH AT BEDTIME AS NEEDED SLEEP     Not Delegated - Psychiatry:  Anxiolytics/Hypnotics Failed - 08/22/2023  2:16 PM      Failed - This refill cannot be delegated      Failed - Urine Drug Screen completed in last 360 days      Passed - Valid encounter within last 6 months    Recent Outpatient Visits           1 week ago Diverticulitis large intestine w/o perforation or abscess w/bleeding   [facility] at [facility], Leita DEL, MD   3 months ago Essential (primary) hypertension   [place] [facility] at [facility], Leita DEL, MD       Future Appointments             In 2 months Justus Leita DEL, MD [facility] at [facility], [facility]               Requested Prescriptions  Pending Prescriptions Disp Refills   zolpidem  (AMBIEN  CR) 6.25 MG CR tablet [Pharmacy Med Name: ZOLPIDEM  TART ER 6.25 MG TAB] 60 tablet     Sig: TAKE 2 TABLETS BY MOUTH AT BEDTIME AS NEEDED SLEEP     Not Delegated - Psychiatry:  Anxiolytics/Hypnotics Failed - 08/22/2023  2:16 PM      Failed - This refill cannot be delegated      Failed - Urine Drug Screen completed in last 360 days      Passed - Valid encounter within last 6 months    Recent Outpatient Visits           1 week ago Diverticulitis large intestine w/o perforation or abscess w/bleeding   [facility] Medicine at [facility], Leita DEL, MD   3 months ago Essential (primary) hypertension   [facility] at [facility], Leita DEL, MD       Future Appointments             In 2 months Justus, Leita DEL, MD [facility] at [facility], [facility]

## 2023-08-22 NOTE — Telephone Encounter (Signed)
 Please review.  KP

## 2023-08-24 ENCOUNTER — Other Ambulatory Visit: Payer: Self-pay | Admitting: Internal Medicine

## 2023-08-24 DIAGNOSIS — K219 Gastro-esophageal reflux disease without esophagitis: Secondary | ICD-10-CM

## 2023-08-25 NOTE — Telephone Encounter (Signed)
 Requested Prescriptions  Pending Prescriptions Disp Refills   omeprazole  (PRILOSEC) 40 MG capsule [Pharmacy Med Name: OMEPRAZOLE  DR 40 MG CAPSULE] 90 capsule 3    Sig: TAKE 1 CAPSULE BY MOUTH DAILY     Gastroenterology: Proton Pump Inhibitors Passed - 08/25/2023  2:57 PM      Passed - Valid encounter within last 12 months    Recent Outpatient Visits           1 week ago Diverticulitis large intestine w/o perforation or abscess w/bleeding   [facility] Medicine at [facility], Leita DEL, MD   3 months ago Essential (primary) hypertension   [place] [facility] at [facility], Leita DEL, MD       Future Appointments             In 2 months Justus, Leita DEL, MD [facility] Medicine at [facility], [facility]

## 2023-08-29 ENCOUNTER — Telehealth: Payer: Self-pay

## 2023-08-29 NOTE — Telephone Encounter (Signed)
 Copied from CRM [id number]. Topic: Clinical - Medical Advice >> Aug 29, 2023  9:15 AM Kevelyn M wrote: Reason for CRM: [place] with [facility] calling to see if the CT scan ordered needs a contrast because it's not marked with or without. Patient's appointment on 08/31/2023. Call back #[phone] ex [phone]

## 2023-08-29 NOTE — Telephone Encounter (Signed)
 Tried calling # back. No answer but left VM to be called back to give this info.  *[id number] Please let them know WITHOUT CONTRAST if they call again.   Thanks!!

## 2023-08-29 NOTE — Telephone Encounter (Signed)
 Which one dose she need and I will call them back?  Thanks!

## 2023-08-31 ENCOUNTER — Encounter: Payer: Self-pay | Admitting: Internal Medicine

## 2023-08-31 ENCOUNTER — Ambulatory Visit: Payer: Self-pay | Admitting: Internal Medicine

## 2023-08-31 DIAGNOSIS — K5733 Diverticulitis of large intestine without perforation or abscess with bleeding: Secondary | ICD-10-CM

## 2023-08-31 MED ORDER — CIPROFLOXACIN HCL 500 MG PO TABS: 500.0000 mg | ORAL_TABLET | Freq: Two times a day (BID) | ORAL | 0 refills | Status: DC

## 2023-08-31 MED ORDER — METRONIDAZOLE 500 MG PO TABS: 500.0000 mg | ORAL_TABLET | Freq: Three times a day (TID) | ORAL | 0 refills | Status: DC

## 2023-09-01 ENCOUNTER — Other Ambulatory Visit: Payer: Self-pay | Admitting: Internal Medicine

## 2023-09-01 DIAGNOSIS — K5733 Diverticulitis of large intestine without perforation or abscess with bleeding: Secondary | ICD-10-CM

## 2023-09-01 MED ORDER — AMOXICILLIN-POT CLAVULANATE 875-125 MG PO TABS: 1.0000 | ORAL_TABLET | Freq: Two times a day (BID) | ORAL | 0 refills | Status: AC

## 2023-09-01 NOTE — Progress Notes (Unsigned)
 Date:  09/01/2023   Name:  Belinda Sanchez   DOB:  15-Dec-1940   MRN:  [id number]   Chief Complaint: No chief complaint on file.  HPI  Review of Systems   Lab Results  Component Value Date   NA 135 (A) 04/28/2023   K 3.9 04/28/2023   CO2 18 04/28/2023   GLUCOSE 96 05/05/2022   BUN 10 04/28/2023   CREATININE 0.8 04/28/2023   CALCIUM 10.2 05/05/2022   EGFR 79 04/28/2023   GFRNONAA 84 04/09/2020   Lab Results  Component Value Date   CHOL 261 (H) 05/05/2022   HDL 109 05/05/2022   LDLCALC 134 (H) 05/05/2022   TRIG 110 05/05/2022   CHOLHDL 2.4 05/05/2022   Lab Results  Component Value Date   TSH 23.81 (A) 04/28/2023   Lab Results  Component Value Date   HGBA1C 5.3 04/30/2021   Lab Results  Component Value Date   WBC 9.6 04/28/2023   HGB 14.8 04/28/2023   HCT 42 04/28/2023   MCV 99 (H) 05/05/2022   PLT 162 04/28/2023   Lab Results  Component Value Date   ALT 15 04/28/2023   AST 18 04/28/2023   ALKPHOS 70 04/28/2023   BILITOT 0.7 05/05/2022   Lab Results  Component Value Date   VD25OH 63.4 03/19/2019     Patient Active Problem List   Diagnosis Date Noted   GERD without esophagitis 08/01/2022   Compression fracture of L1 lumbar vertebra (HCC) 09/30/2021   Osteopenia 11/23/2020   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) 06/11/2020   Hepatic steatosis 08/24/2018   Elevated LFTs 08/21/2018   Rotator cuff tendonitis, right 08/20/2018   Arthritis of foot, left 12/20/2016   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   Herpes simplex infection 12/15/2014   Acquired hypothyroidism 12/03/2014   Crohn's colitis, without complications (HCC) 12/03/2014   Dyslipidemia 12/03/2014   Essential (primary) hypertension 12/03/2014   Idiopathic insomnia 12/03/2014   Osteoarthritis of right knee 12/03/2014   Avitaminosis D 12/03/2014   History of breast cancer 06/27/2014     Allergies  Allergen Reactions   Clindamycin Diarrhea and Other (See Comments)    Abdominal pain   Tizanidine  Other (See Comments)    Dizziness, dry mouth   Lozol  [Indapamide]     elevated serum calcium   Sulfa Antibiotics Other (See Comments)    Reports excess bleeding. Advised not to take it by GI per patient Reports excess bleeding. Advised not to take it by GI per patient    Loratadine Palpitations    Past Surgical History:  Procedure Laterality Date   APPENDECTOMY     BREAST EXCISIONAL BIOPSY Right 04/2014   benign   BREAST SURGERY     COLONOSCOPY  08/2010   one polyp - repeat 5 yrs   EYE SURGERY Right 04/02/2019   cataract    JOINT REPLACEMENT  June 2021   MASTECTOMY, RADICAL Left 2009   SHOULDER SURGERY  2016   AVN   TOTAL SHOULDER REPLACEMENT Right 09/2019   VAGINAL HYSTERECTOMY     total    Social History   Tobacco Use   Smoking status: Never   Smokeless tobacco: Never  Vaping Use   Vaping status: Never Used  Substance Use Topics   Alcohol use: Yes    Alcohol/week: 16.0 standard drinks of alcohol    Types: 2 Standard drinks or equivalent, 14 Glasses of wine per  week   Drug use: No     Medication list has been reviewed and updated.  No outpatient medications have been marked as taking for the 09/01/23 encounter (Orders Only) with Justus Leita DEL, MD.       08/15/2023    8:52 AM 05/08/2023    9:57 AM 03/03/2023   10:20 AM 12/12/2022    9:34 AM  GAD 7 : Generalized Anxiety Score  Nervous, Anxious, on Edge 0 0 3 0  Control/stop worrying 0 1 2 0  Worry too much - different things 0 0 0 0  Trouble relaxing 0 0 1 0  Restless 0 0 0 0  Easily annoyed or irritable 0 0 0 0  Afraid - awful might happen 0 1 0 0  Total GAD 7 Score 0 2 6 0  Anxiety Difficulty Not difficult at all Not difficult at all Not difficult at all Not difficult at all       08/15/2023    8:52 AM 05/08/2023    9:57 AM 03/03/2023   10:19 AM  Depression screen PHQ 2/9   Decreased Interest 0 1 0  Down, Depressed, Hopeless 0 0 0  PHQ - 2 Score 0 1 0  Altered sleeping 0 2 2  Tired, decreased energy 0 2 0  Change in appetite 0 0 2  Feeling bad or failure about yourself  0 0 0  Trouble concentrating 0 0 0  Moving slowly or fidgety/restless 0 1 0  Suicidal thoughts 0 0 0  PHQ-9 Score 0 6 4  Difficult doing work/chores Not difficult at all Not difficult at all Not difficult at all    BP Readings from Last 3 Encounters:  08/15/23 118/72  05/08/23 112/64  03/03/23 122/68    Physical Exam  Wt Readings from Last 3 Encounters:  08/15/23 147 lb (66.7 kg)  05/08/23 142 lb 8 oz (64.6 kg)  03/03/23 147 lb (66.7 kg)    There were no vitals taken for this visit.  Assessment and Plan:  Problem List Items Addressed This Visit   None   No follow-ups on file.    Leita HILARIO Justus, MD [facility] [place]

## 2023-09-01 NOTE — Telephone Encounter (Signed)
 Please review.  KP

## 2023-09-01 NOTE — Telephone Encounter (Signed)
 Pt response.  KP

## 2023-09-08 ENCOUNTER — Encounter: Payer: Self-pay | Admitting: Internal Medicine

## 2023-09-14 NOTE — Telephone Encounter (Signed)
 Per Dr.B notes if no improvement she may need CT imaging. Recommend follow up visit or go to UC.

## 2023-09-15 ENCOUNTER — Encounter: Payer: Self-pay | Admitting: Family Medicine

## 2023-09-15 ENCOUNTER — Ambulatory Visit: Admitting: Family Medicine

## 2023-09-15 ENCOUNTER — Ambulatory Visit (INDEPENDENT_AMBULATORY_CARE_PROVIDER_SITE_OTHER): Admitting: Family Medicine

## 2023-09-15 VITALS — BP 116/60 | HR 50 | Ht 64.0 in | Wt 144.4 lb

## 2023-09-15 DIAGNOSIS — K5732 Diverticulitis of large intestine without perforation or abscess without bleeding: Secondary | ICD-10-CM | POA: Diagnosis not present

## 2023-09-15 NOTE — Assessment & Plan Note (Signed)
 History of Present Illness Belinda Sanchez is an 83 year old female with diverticulitis who presents for follow-up after experiencing side effects from medication.  Abdominal pain and gastrointestinal symptoms - Diverticulitis diagnosed, confirmed by CT scan 08/31/2023 - Initial pain localized to the left side near the waist, not throbbing - Current pain level is 80% improved compared to initial presentation - Initial constipation with straining, improved with Augmentin , worsened with Cipro  and Flagyl , self-discontinued 7/30 - Current bowel movements are loose but not diarrhea, occurring 2 to 2.5 times daily, which is not her baseline pattern - No fever, chills, or clamminess  Medication side effects - Initially treated with Augmentin  without sufficient improvement - Switched to ciprofloxacin  and metronidazole , taken for five days - Severe side effects from Cipro  and Flagyl  including discolored urine, nausea, and stomach cramps - Discontinued Cipro  and Flagyl  on September 13, 2023 - No antibiotics taken since September 13, 2023  Appetite and upper gastrointestinal symptoms - Reduced appetite but able to eat, prefers fruit - Belching present - No reflux or vomiting  Physical Exam CHEST: Lungs clear to auscultation bilaterally, no wheezes, rales, or rhonchi CARDIOVASCULAR: Regular rhythm, normal S1 and S2, no additional heart sounds ABDOMEN: Soft, non-tender, non-distended with normal active bowel sounds. Tenderness at the left lower quadrant and superiorly, no rebound tenderness. No costovertebral angle tenderness bilaterally  Results RADIOLOGY Abdominal CT: Confirmed diverticulitis in the sigmoid colon (08/15/2023) Lumbar spine X-ray: Old L1 compression fracture  Assessment and Plan Diverticulitis of sigmoid colon Diverticulitis confirmed by CT. Augmentin  ineffective; Cipro  and Flagyl  caused severe side effects. 80% symptom improvement without medication for two days. No systemic infection  signs. Bowel movements improving, appetite stable. - Advise dietary modifications to include easy-to-digest foods such as soups and broths. - Recommend starting an over-the-counter probiotic to restore gut flora. - Instruct to monitor symptoms and contact the office if no improvement or worsening by mid-next week.  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 Symptoms consistent with baseline gastritis. - Manage with dietary adjustments as needed.

## 2023-09-15 NOTE — Progress Notes (Signed)
     Primary Care / Sports Medicine Office Visit  Patient Information:  Patient ID: Belinda Sanchez, female DOB: 05-19-1940 Age: 83 y.o. MRN: [id number]   Belinda Sanchez is a pleasant 83 y.o. female presenting with the following:  Chief Complaint  Patient presents with   Abdominal Pain    Patient presents for continued abdominal pain. She had been taking flagyl  and cipro  but has stopped because it was making her sick, headaches, and nausea.     Vitals:   09/15/23 1055  BP: 116/60  Pulse: (!) 50  SpO2: 98%   Vitals:   09/15/23 1055  Weight: 144 lb 6.4 oz (65.5 kg)  Height: 5' 4 (1.626 m)   Body mass index is 24.79 kg/m.  No results found.   Independent interpretation of notes and tests performed by another provider:   None  Procedures performed:   None  Pertinent History, Exam, Impression, and Recommendations:   Problem List Items Addressed This Visit     Diverticulitis of sigmoid colon - Primary   History of Present Illness Belinda Sanchez is an 83 year old female with diverticulitis who presents for follow-up after experiencing side effects from medication.  Abdominal pain and gastrointestinal symptoms - Diverticulitis diagnosed, confirmed by CT scan 08/31/2023 - Initial pain localized to the left side near the waist, not throbbing - Current pain level is 80% improved compared to initial presentation - Initial constipation with straining, improved with Augmentin , worsened with Cipro  and Flagyl , self-discontinued 7/30 - Current bowel movements are loose but not diarrhea, occurring 2 to 2.5 times daily, which is not her baseline pattern - No fever, chills, or clamminess  Medication side effects - Initially treated with Augmentin  without sufficient improvement - Switched to ciprofloxacin  and metronidazole , taken for five days - Severe side effects from Cipro  and Flagyl  including discolored urine, nausea, and stomach cramps - Discontinued Cipro  and Flagyl  on September 13, 2023 - No antibiotics taken since September 13, 2023  Appetite and upper gastrointestinal symptoms - Reduced appetite but able to eat, prefers fruit - Belching present - No reflux or vomiting  Physical Exam CHEST: Lungs clear to auscultation bilaterally, no wheezes, rales, or rhonchi CARDIOVASCULAR: Regular rhythm, normal S1 and S2, no additional heart sounds ABDOMEN: Soft, non-tender, non-distended with normal active bowel sounds. Tenderness at the left lower quadrant and superiorly, no rebound tenderness. No costovertebral angle tenderness bilaterally  Results RADIOLOGY Abdominal CT: Confirmed diverticulitis in the sigmoid colon (08/15/2023) Lumbar spine X-ray: Old L1 compression fracture  Assessment and Plan Diverticulitis of sigmoid colon Diverticulitis confirmed by CT. Augmentin  ineffective; Cipro  and Flagyl  caused severe side effects. 80% symptom improvement without medication for two days. No systemic infection signs. Bowel movements improving, appetite stable. - Advise dietary modifications to include easy-to-digest foods such as soups and broths. - Recommend starting an over-the-counter probiotic to restore gut flora. - Instruct to monitor symptoms and contact the office if no improvement or worsening by mid-next week.  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 Symptoms consistent with baseline gastritis. - Manage with dietary adjustments as needed.        Orders & Medications Medications: No orders of the defined types were placed in this encounter.  No orders of the defined types were placed in this encounter.    No follow-ups on file.     Selinda JINNY Ku, MD, [facility]   Primary Care Sports Medicine Primary Care and Sports Medicine at [facility] [place]

## 2023-09-15 NOTE — Patient Instructions (Signed)
 Patient Plan for Post-Visit Guidance  - Eat easy-to-digest foods such as soups and broths while recovering from diverticulitis. - Start an over-the-counter probiotic to help restore gut health. - Make dietary adjustments as needed to manage gastritis symptoms. - Monitor your symptoms and contact the office if you do not improve or if symptoms worsen by mid-next week.  Red Flags: - If you develop severe abdominal pain, fever, vomiting, blood in your stool, or any new or concerning symptoms, contact the office immediately.

## 2023-10-22 ENCOUNTER — Other Ambulatory Visit: Payer: Self-pay | Admitting: Internal Medicine

## 2023-10-23 NOTE — Telephone Encounter (Signed)
 Requested Prescriptions  Pending Prescriptions Disp Refills   diltiazem  (TIADYLT  ER) 240 MG 24 hr capsule [Pharmacy Med Name: TIADYLT  ER 240 MG CAPSULE] 90 capsule 0    Sig: TAKE 1 CAPSULE BY MOUTH EVERY DAY     Cardiovascular: Calcium Channel Blockers 3 Passed - 10/23/2023  3:42 PM      Passed - ALT in normal range and within 360 days    ALT  Date Value Ref Range Status  04/28/2023 15 7 - 35 U/L Final         Passed - AST in normal range and within 360 days    AST  Date Value Ref Range Status  04/28/2023 18 13 - 35 Final         Passed - Cr in normal range and within 360 days    Creatinine  Date Value Ref Range Status  04/28/2023 0.8 0.5 - 1.1 Final   Creatinine, Ser  Date Value Ref Range Status  05/05/2022 0.91 0.57 - 1.00 mg/dL Final         Passed - Last BP in normal range    BP Readings from Last 1 Encounters:  09/15/23 116/60         Passed - Last Heart Rate in normal range    Pulse Readings from Last 1 Encounters:  09/15/23 (!) 50         Passed - Valid encounter within last 6 months    Recent Outpatient Visits           1 month ago Diverticulitis of sigmoid colon   [place] [facility] [place] Alvia, Selinda PARAS, MD   2 months ago Diverticulitis large intestine w/o perforation or abscess w/bleeding   [facility] at [facility], Leita DEL, MD   5 months ago Essential (primary) hypertension   [place] [facility] at [facility], Leita DEL, MD       Future Appointments             In 1 week Justus, Leita DEL, MD [facility] at [facility], [phone] [place]

## 2023-10-31 ENCOUNTER — Encounter: Payer: Self-pay | Admitting: Internal Medicine

## 2023-10-31 ENCOUNTER — Ambulatory Visit (INDEPENDENT_AMBULATORY_CARE_PROVIDER_SITE_OTHER): Admitting: Internal Medicine

## 2023-10-31 VITALS — BP 122/72 | HR 59 | Ht 64.0 in | Wt 146.0 lb

## 2023-10-31 DIAGNOSIS — I10 Essential (primary) hypertension: Secondary | ICD-10-CM

## 2023-10-31 DIAGNOSIS — K5901 Slow transit constipation: Secondary | ICD-10-CM | POA: Insufficient documentation

## 2023-10-31 DIAGNOSIS — K5010 Crohn's disease of large intestine without complications: Secondary | ICD-10-CM

## 2023-10-31 DIAGNOSIS — E039 Hypothyroidism, unspecified: Secondary | ICD-10-CM | POA: Diagnosis not present

## 2023-10-31 DIAGNOSIS — R0981 Nasal congestion: Secondary | ICD-10-CM

## 2023-10-31 MED ORDER — LOSARTAN POTASSIUM 50 MG PO TABS: 50.0000 mg | ORAL_TABLET | Freq: Every day | ORAL | 1 refills | Status: AC

## 2023-10-31 MED ORDER — FLUTICASONE PROPIONATE 50 MCG/ACT NA SUSP: 2.0000 | Freq: Every day | NASAL | 0 refills | Status: AC

## 2023-10-31 NOTE — Progress Notes (Signed)
 Date:  10/31/2023   Name:  Belinda Sanchez   DOB:  [birth year]   MRN:  [id number]   Chief Complaint: Hypertension  Hypertension This is a chronic problem. The problem is controlled. Pertinent negatives include no chest pain, headaches, palpitations or shortness of breath. Past treatments include angiotensin blockers, calcium channel blockers and beta blockers. The current treatment provides significant improvement. Identifiable causes of hypertension include a thyroid  problem.  Thyroid  Problem Presents for follow-up visit. Symptoms include constipation. Patient reports no anxiety, diarrhea, fatigue or palpitations.  Constipation This is a chronic problem. The problem is unchanged. Her stool frequency is 1 time per day. The stool is described as scybalous. The patient is on a high fiber diet. She Exercises regularly. There has Been adequate water intake. Pertinent negatives include no abdominal pain or diarrhea.  Otalgia  There is pain in the left ear. This is a new problem. The current episode started yesterday. The problem occurs constantly. The problem has been gradually improving. There has been no fever. Pertinent negatives include no abdominal pain, coughing, diarrhea or headaches.    Review of Systems  Constitutional:  Negative for chills, fatigue and unexpected weight change.  HENT:  Positive for congestion and ear pain. Negative for trouble swallowing.   Eyes:  Negative for visual disturbance.  Respiratory:  Negative for cough, chest tightness, shortness of breath and wheezing.   Cardiovascular:  Negative for chest pain, palpitations and leg swelling.  Gastrointestinal:  Positive for constipation. Negative for abdominal pain and diarrhea.  Musculoskeletal:  Negative for arthralgias and myalgias.  Neurological:  Negative for dizziness, weakness, light-headedness and headaches.  Psychiatric/Behavioral:  Positive for sleep disturbance. Negative for dysphoric mood. The patient is not  nervous/anxious.      Lab Results  Component Value Date   NA 135 (A) 04/28/2023   K 3.9 04/28/2023   CO2 18 04/28/2023   GLUCOSE 96 05/05/2022   BUN 10 04/28/2023   CREATININE 0.8 04/28/2023   CALCIUM 10.2 05/05/2022   EGFR 79 04/28/2023   GFRNONAA 84 04/09/2020   Lab Results  Component Value Date   CHOL 261 (H) 05/05/2022   HDL 109 05/05/2022   LDLCALC 134 (H) 05/05/2022   TRIG 110 05/05/2022   CHOLHDL 2.4 05/05/2022   Lab Results  Component Value Date   TSH 23.81 (A) 04/28/2023   Lab Results  Component Value Date   HGBA1C 5.3 04/30/2021   Lab Results  Component Value Date   WBC 9.6 04/28/2023   HGB 14.8 04/28/2023   HCT 42 04/28/2023   MCV 99 (H) 05/05/2022   PLT 162 04/28/2023   Lab Results  Component Value Date   ALT 15 04/28/2023   AST 18 04/28/2023   ALKPHOS 70 04/28/2023   BILITOT 0.7 05/05/2022   Lab Results  Component Value Date   VD25OH 63.4 03/19/2019     Patient Active Problem List   Diagnosis Date Noted   Slow transit constipation 10/31/2023   Diverticulitis of sigmoid colon 09/15/2023   GERD without esophagitis 08/01/2022   Compression fracture of L1 lumbar vertebra (HCC) 09/30/2021   Osteopenia 11/23/2020   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) 06/11/2020   Hepatic steatosis 08/24/2018   Elevated LFTs 08/21/2018   Rotator cuff tendonitis, right 08/20/2018   Arthritis of foot, left 12/20/2016   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   Herpes simplex infection 12/15/2014   Acquired hypothyroidism 12/03/2014  Crohn's colitis, without complications (HCC) 12/03/2014   Dyslipidemia 12/03/2014   Essential (primary) hypertension 12/03/2014   Idiopathic insomnia 12/03/2014   Osteoarthritis of right knee 12/03/2014   Avitaminosis D 12/03/2014   History of breast cancer 06/27/2014    Allergies  Allergen Reactions   Clindamycin Diarrhea and  Other (See Comments)    Abdominal pain   Tizanidine  Other (See Comments)    Dizziness, dry mouth   Lozol  [Indapamide]     elevated serum calcium   Sulfa Antibiotics Other (See Comments)    Reports excess bleeding. Advised not to take it by GI per patient Reports excess bleeding. Advised not to take it by GI per patient    Loratadine Palpitations    Past Surgical History:  Procedure Laterality Date   APPENDECTOMY     BREAST EXCISIONAL BIOPSY Right 04/2014   benign   BREAST SURGERY     COLONOSCOPY  08/2010   one polyp - repeat 5 yrs   EYE SURGERY Right 04/02/2019   cataract    JOINT REPLACEMENT  June 2021   MASTECTOMY, RADICAL Left 2009   SHOULDER SURGERY  2016   AVN   TOTAL SHOULDER REPLACEMENT Right 09/2019   VAGINAL HYSTERECTOMY     total    Social History   Tobacco Use   Smoking status: Never   Smokeless tobacco: Never  Vaping Use   Vaping status: Never Used  Substance Use Topics   Alcohol use: Yes    Alcohol/week: 16.0 standard drinks of alcohol    Types: 2 Standard drinks or equivalent, 14 Glasses of wine per week   Drug use: No     Medication list has been reviewed and updated.  Current Meds  Medication Sig   oxyCODONE-acetaminophen  (PERCOCET/ROXICET) 5-325 MG tablet Take by mouth.       10/31/2023    8:29 AM 08/15/2023    8:52 AM 05/08/2023    9:57 AM 03/03/2023   10:20 AM  GAD 7 : Generalized Anxiety Score  Nervous, Anxious, on Edge 0 0 0 3  Control/stop worrying 0 0 1 2  Worry too much - different things 0 0 0 0  Trouble relaxing 0 0 0 1  Restless 0 0 0 0  Easily annoyed or irritable 0 0 0 0  Afraid - awful might happen 0 0 1 0  Total GAD 7 Score 0 0 2 6  Anxiety Difficulty Not difficult at all Not difficult at all Not difficult at all Not difficult at all       10/31/2023    8:29 AM 09/15/2023   10:59 AM 08/15/2023    8:52 AM  Depression screen PHQ 2/9  Decreased Interest 0 0 0  Down, Depressed, Hopeless 0 0 0  PHQ - 2 Score 0 0 0   Altered sleeping   0  Tired, decreased energy   0  Change in appetite   0  Feeling bad or failure about yourself    0  Trouble concentrating   0  Moving slowly or fidgety/restless   0  Suicidal thoughts   0  PHQ-9 Score   0  Difficult doing work/chores   Not difficult at all    BP Readings from Last 3 Encounters:  10/31/23 122/72  09/15/23 116/60  08/15/23 118/72    Physical Exam Vitals and nursing note reviewed.  Constitutional:      General: She is not in acute distress.    Appearance: Normal appearance. She is well-developed.  HENT:     Head: Normocephalic and atraumatic.     Right Ear: Hearing, tympanic membrane and ear canal normal.     Left Ear: Ear canal normal. Tympanic membrane is erythematous and retracted.  Cardiovascular:     Rate and Rhythm: Normal rate and regular rhythm.     Heart sounds: No murmur heard. Pulmonary:     Effort: Pulmonary effort is normal. No respiratory distress.     Breath sounds: No wheezing or rhonchi.  Musculoskeletal:     Cervical back: Normal range of motion.  Skin:    General: Skin is warm and dry.     Findings: No rash.  Neurological:     Mental Status: She is alert and oriented to person, place, and time.  Psychiatric:        Mood and Affect: Mood normal.        Behavior: Behavior normal.     Wt Readings from Last 3 Encounters:  10/31/23 146 lb (66.2 kg)  09/15/23 144 lb 6.4 oz (65.5 kg)  08/15/23 147 lb (66.7 kg)    BP 122/72   Pulse (!) 59   Ht 5' 4 (1.626 m)   Wt 146 lb (66.2 kg)   SpO2 96%   BMI 25.06 kg/m   Assessment and Plan:  Problem List Items Addressed This Visit       Unprioritized   Acquired hypothyroidism (Chronic)   Thyroid  supplemented. TSH slightly high last visit but T4 normal. Will repeat labs and adjust dose if needed.      Relevant Orders   TSH+T4F+T3Free   Essential (primary) hypertension - Primary (Chronic)   Blood pressure is well controlled on losartan , Cardiazem and  Bystolic . No medication side effects noted. Plan to continue current medications.       Relevant Medications   losartan  (COZAAR ) 50 MG tablet   Crohn's colitis, without complications (HCC)   Stable on no medication at this time.      Slow transit constipation   Continue probiotics and add Colace or Miralax daily      Other Visit Diagnoses       Nasal congestion       likely causing ear pain and eustachian tube dysfunction begin Flonase  NS daily   Relevant Medications   fluticasone  (FLONASE ) 50 MCG/ACT nasal spray       No follow-ups on file.    Leita HILARIO Adie, MD [facility] [place]

## 2023-10-31 NOTE — Assessment & Plan Note (Signed)
 Stable on no medication at this time.

## 2023-10-31 NOTE — Assessment & Plan Note (Signed)
 Thyroid  supplemented. TSH slightly high last visit but T4 normal. Will repeat labs and adjust dose if needed.

## 2023-10-31 NOTE — Assessment & Plan Note (Signed)
 Blood pressure is well controlled on losartan , Cardiazem and Bystolic . No medication side effects noted. Plan to continue current medications.

## 2023-10-31 NOTE — Patient Instructions (Signed)
 Use Flonase  nasal spray daily for congestion and ear discomfort.  Take Miralax or Colace every day to relieve constipation.

## 2023-10-31 NOTE — Assessment & Plan Note (Signed)
 Continue probiotics and add Colace or Miralax daily

## 2023-11-01 ENCOUNTER — Ambulatory Visit: Payer: Self-pay | Admitting: Internal Medicine

## 2023-11-01 LAB — TSH+T4F+T3FREE
Free T4: 1.45 ng/dL (ref 0.82–1.77)
T3, Free: 2.2 pg/mL (ref 2.0–4.4)
TSH: 3.73 u[IU]/mL (ref 0.450–4.500)

## 2023-11-05 ENCOUNTER — Other Ambulatory Visit: Payer: Self-pay | Admitting: Internal Medicine

## 2023-11-05 DIAGNOSIS — I10 Essential (primary) hypertension: Secondary | ICD-10-CM

## 2023-11-06 NOTE — Telephone Encounter (Signed)
 Requested Prescriptions  Refused Prescriptions Disp Refills   losartan  (COZAAR ) 50 MG tablet [Pharmacy Med Name: LOSARTAN  POTASSIUM 50 MG TAB] 90 tablet 1    Sig: TAKE 1 TABLET BY MOUTH DAILY     Cardiovascular:  Angiotensin Receptor Blockers Failed - 11/06/2023  5:56 PM      Failed - Cr in normal range and within 180 days    Creatinine  Date Value Ref Range Status  04/28/2023 0.8 0.5 - 1.1 Final   Creatinine, Ser  Date Value Ref Range Status  05/05/2022 0.91 0.57 - 1.00 mg/dL Final         Failed - K in normal range and within 180 days    Potassium  Date Value Ref Range Status  04/28/2023 3.9 3.5 - 5.1 mEq/L Final         Passed - Patient is not pregnant      Passed - Last BP in normal range    BP Readings from Last 1 Encounters:  10/31/23 122/72         Passed - Valid encounter within last 6 months    Recent Outpatient Visits           6 days ago Essential (primary) hypertension   [place] [facility] at [facility], Leita DEL, MD   1 month ago Diverticulitis of sigmoid colon   [facility] at [facility] Selinda PARAS, MD   2 months ago Diverticulitis large intestine w/o perforation or abscess w/bleeding   [facility] at [facility], Leita DEL, MD   6 months ago Essential (primary) hypertension   [facility] at [facility], Leita DEL, MD

## 2024-01-01 ENCOUNTER — Encounter: Payer: Self-pay | Admitting: Internal Medicine

## 2024-01-01 ENCOUNTER — Other Ambulatory Visit: Payer: Self-pay | Admitting: Internal Medicine

## 2024-01-01 DIAGNOSIS — K5732 Diverticulitis of large intestine without perforation or abscess without bleeding: Secondary | ICD-10-CM

## 2024-01-01 MED ORDER — AMOXICILLIN-POT CLAVULANATE 875-125 MG PO TABS: 1.0000 | ORAL_TABLET | Freq: Two times a day (BID) | ORAL | 0 refills | Status: AC

## 2024-01-01 NOTE — Telephone Encounter (Signed)
 Please review and advise patient.   JM

## 2024-01-01 NOTE — Progress Notes (Unsigned)
 Date:  01/01/2024   Name:  Belinda Sanchez   DOB:  [birth year]   MRN:  [id number]   Chief Complaint: No chief complaint on file.  HPI  Review of Systems   Lab Results  Component Value Date   NA 135 (A) 04/28/2023   K 3.9 04/28/2023   CO2 18 04/28/2023   GLUCOSE 96 05/05/2022   BUN 10 04/28/2023   CREATININE 0.8 04/28/2023   CALCIUM 10.2 05/05/2022   EGFR 79 04/28/2023   GFRNONAA 84 04/09/2020   Lab Results  Component Value Date   CHOL 261 (H) 05/05/2022   HDL 109 05/05/2022   LDLCALC 134 (H) 05/05/2022   TRIG 110 05/05/2022   CHOLHDL 2.4 05/05/2022   Lab Results  Component Value Date   TSH 3.730 10/31/2023   Lab Results  Component Value Date   HGBA1C 5.3 04/30/2021   Lab Results  Component Value Date   WBC 9.6 04/28/2023   HGB 14.8 04/28/2023   HCT 42 04/28/2023   MCV 99 (H) 05/05/2022   PLT 162 04/28/2023   Lab Results  Component Value Date   ALT 15 04/28/2023   AST 18 04/28/2023   ALKPHOS 70 04/28/2023   BILITOT 0.7 05/05/2022   Lab Results  Component Value Date   VD25OH 63.4 03/19/2019     Patient Active Problem List   Diagnosis Date Noted   Slow transit constipation 10/31/2023   Diverticulitis of sigmoid colon 09/15/2023   GERD without esophagitis 08/01/2022   Compression fracture of L1 lumbar vertebra (HCC) 09/30/2021   Osteopenia 11/23/2020   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) 06/11/2020   Hepatic steatosis 08/24/2018   Elevated LFTs 08/21/2018   Rotator cuff tendonitis, right 08/20/2018   Arthritis of foot, left 12/20/2016   Inflammatory spondylopathy of lumbar region 08/08/2015   Herpes simplex infection 12/15/2014   Acquired hypothyroidism 12/03/2014   Crohn's colitis, without complications (HCC) 12/03/2014   Dyslipidemia 12/03/2014   Essential (primary) hypertension 12/03/2014   Idiopathic insomnia 12/03/2014   Osteoarthritis of right knee 12/03/2014    Avitaminosis D 12/03/2014   History of breast cancer 06/27/2014    Allergies  Allergen Reactions   Clindamycin Diarrhea and Other (See Comments)    Abdominal pain   Tizanidine  Other (See Comments)    Dizziness, dry mouth   Lozol  [Indapamide]     elevated serum calcium   Sulfa Antibiotics Other (See Comments)    Reports excess bleeding. Advised not to take it by GI per patient Reports excess bleeding. Advised not to take it by GI per patient    Loratadine Palpitations    Past Surgical History:  Procedure Laterality Date   APPENDECTOMY     BREAST EXCISIONAL BIOPSY Right 04/2014   benign   BREAST SURGERY     COLONOSCOPY  08/2010   one polyp - repeat 5 yrs   EYE SURGERY Right 04/02/2019   cataract    JOINT REPLACEMENT  June 2021   MASTECTOMY, RADICAL Left 2009   SHOULDER SURGERY  2016   AVN   TOTAL SHOULDER REPLACEMENT Right 09/2019   VAGINAL HYSTERECTOMY     total    Social History   Tobacco Use   Smoking status: Never   Smokeless tobacco: Never  Vaping Use   Vaping status: Never Used  Substance Use Topics   Alcohol use: Yes    Alcohol/week: 16.0 standard drinks of alcohol  Types: 2 Standard drinks or equivalent, 14 Glasses of wine per week   Drug use: No     Medication list has been reviewed and updated.  No outpatient medications have been marked as taking for the 01/01/24 encounter (Orders Only) with Justus Leita DEL, MD.       10/31/2023    8:29 AM 08/15/2023    8:52 AM 05/08/2023    9:57 AM 03/03/2023   10:20 AM  GAD 7 : Generalized Anxiety Score  Nervous, Anxious, on Edge 0 0 0 3  Control/stop worrying 0 0 1 2  Worry too much - different things 0 0 0 0  Trouble relaxing 0 0 0 1  Restless 0 0 0 0  Easily annoyed or irritable 0 0 0 0  Afraid - awful might happen 0 0 1 0  Total GAD 7 Score 0 0 2 6  Anxiety Difficulty Not difficult at all Not difficult at all Not difficult at all Not difficult at all       10/31/2023    8:29 AM 09/15/2023    10:59 AM 08/15/2023    8:52 AM  Depression screen PHQ 2/9  Decreased Interest 0 0 0  Down, Depressed, Hopeless 0 0 0  PHQ - 2 Score 0 0 0  Altered sleeping   0  Tired, decreased energy   0  Change in appetite   0  Feeling bad or failure about yourself    0  Trouble concentrating   0  Moving slowly or fidgety/restless   0  Suicidal thoughts   0  PHQ-9 Score   0   Difficult doing work/chores   Not difficult at all     Data saved with a previous flowsheet row definition    BP Readings from Last 3 Encounters:  10/31/23 122/72  09/15/23 116/60  08/15/23 118/72    Physical Exam  Wt Readings from Last 3 Encounters:  10/31/23 146 lb (66.2 kg)  09/15/23 144 lb 6.4 oz (65.5 kg)  08/15/23 147 lb (66.7 kg)    There were no vitals taken for this visit.  Assessment and Plan:  Problem List Items Addressed This Visit   None   No follow-ups on file.    Leita HILARIO Justus, MD [facility] [place]

## 2024-01-04 ENCOUNTER — Ambulatory Visit: Payer: Self-pay

## 2024-01-04 NOTE — Telephone Encounter (Signed)
 Noted  Pt has appt.  KP

## 2024-01-04 NOTE — Telephone Encounter (Signed)
 FYI Only or Action Required?: FYI only for provider: appointment scheduled on 11/25.  Patient was last seen in primary care on 10/31/2023 by Belinda Leita DEL, MD.  Called Nurse Triage reporting Abdominal Pain.  Symptoms began 1-2 weeks ago.  Interventions attempted: Prescription medications: Augmentin .  Symptoms are: unchanged.  Triage Disposition: See PCP Within 2 Weeks  Patient/caregiver understands and will follow disposition?: Yes     Copied from CRM #[id number]. Topic: Clinical - Red Word Triage >> Jan 04, 2024 10:26 AM [name] wrote: [facility] that prompted transfer to Nurse Triage: diverticulitis flare up       Reason for Disposition  Abdominal pain is a chronic symptom (recurrent or ongoing AND present > 4 weeks)    Patient taking an antibiotic for diverticulitis and wanted a follow up appointment.  Answer Assessment - Initial Assessment Questions Patient messaged DR. Berglund on 11/17 about the same and was placed on Augmentin . Follow-up appointment scheduled.     1. LOCATION: Where does it hurt?      Left sided abdomen  2. RADIATION: Does the pain shoot anywhere else? (e.g., chest, back)     No 3. ONSET: When did the pain begin? (e.g., minutes, hours or days ago)      1-2 weeks ago 5. PATTERN Does the pain come and go, or is it constant?     Constant  6. SEVERITY: How bad is the pain?  (e.g., Scale 1-10; mild, moderate, or severe)     Mild to moderate  7. RECURRENT SYMPTOM: Have you ever had this type of stomach pain before? If Yes, ask: When was the last time? and What happened that time?      Yes 8. CAUSE: What do you think is causing the stomach pain? (e.g., gallstones, recent abdominal surgery)     Believes it's due to her diverticulitis  9. RELIEVING/AGGRAVATING FACTORS: What makes it better or worse? (e.g., antacids, bending or twisting motion, bowel movement)     No 10. OTHER SYMPTOMS: Do you have any other symptoms? (e.g., back  pain, diarrhea, fever, urination pain, vomiting)       No  Protocols used: Abdominal Pain - Female-A-AH

## 2024-01-09 ENCOUNTER — Ambulatory Visit: Admitting: Internal Medicine

## 2024-01-17 ENCOUNTER — Other Ambulatory Visit: Payer: Self-pay | Admitting: Internal Medicine

## 2024-01-17 DIAGNOSIS — F5101 Primary insomnia: Secondary | ICD-10-CM

## 2024-01-18 ENCOUNTER — Encounter: Payer: Self-pay | Admitting: Internal Medicine

## 2024-01-19 ENCOUNTER — Other Ambulatory Visit: Payer: Self-pay | Admitting: Internal Medicine

## 2024-01-19 IMAGING — CR DG LUMBAR SPINE COMPLETE 4+V
5 series · 5 of 5 positions shown · non-contrast
Comparison: None Available.

CLINICAL DATA: Fell last week

EXAM:
LUMBAR SPINE - COMPLETE 4+ VIEW

[l-spine ap]
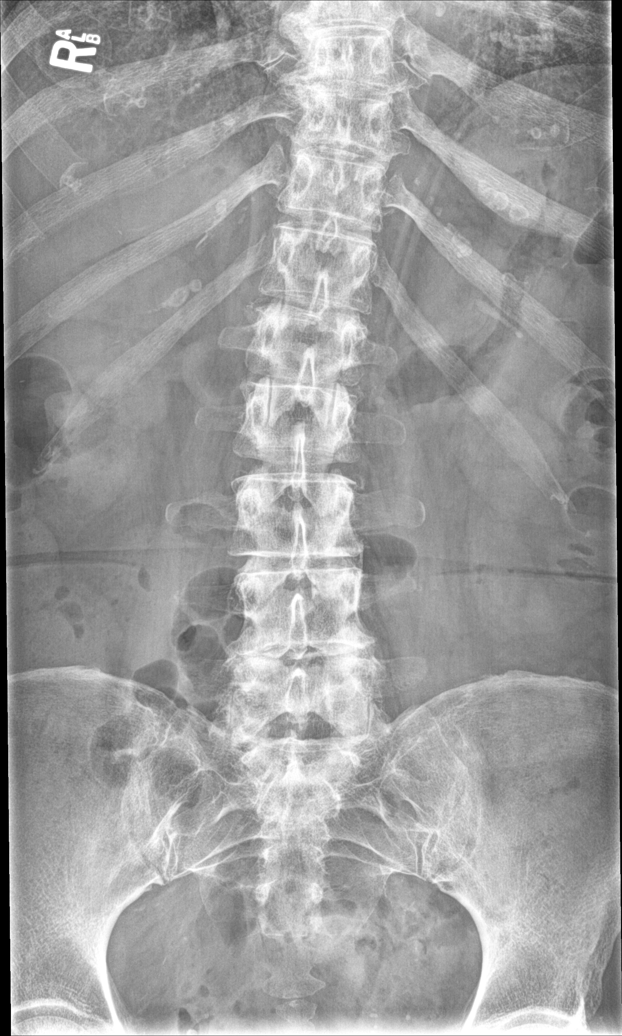

[l-spine obl (1 of 2)]
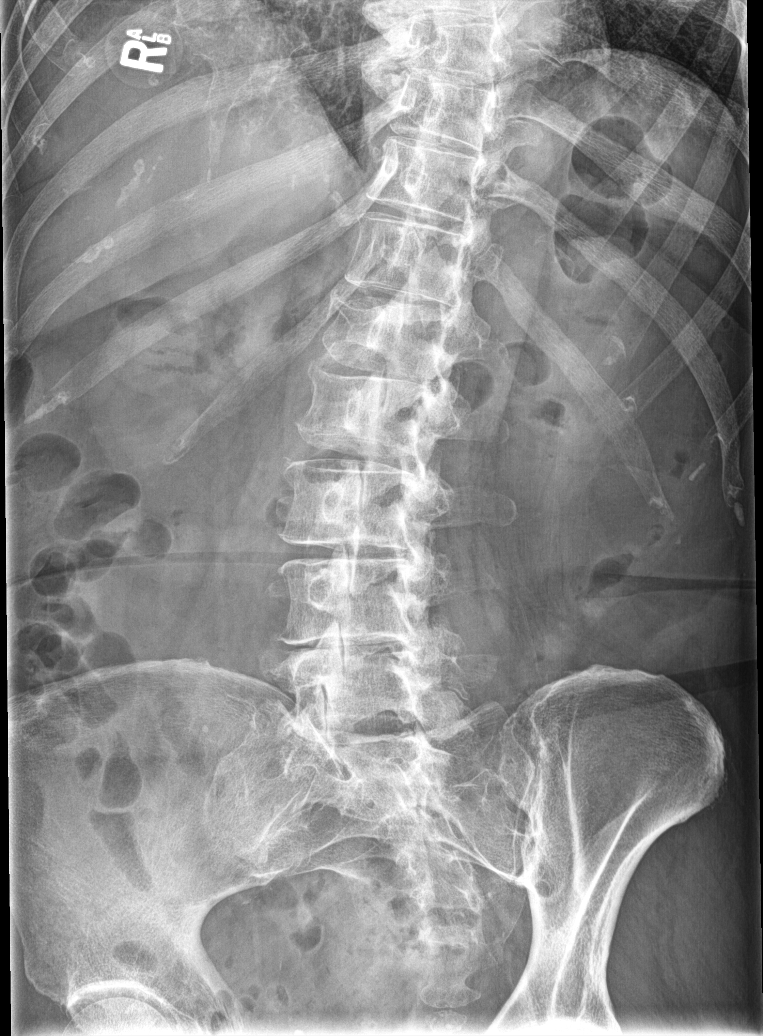

[l-spine obl (2 of 2)]
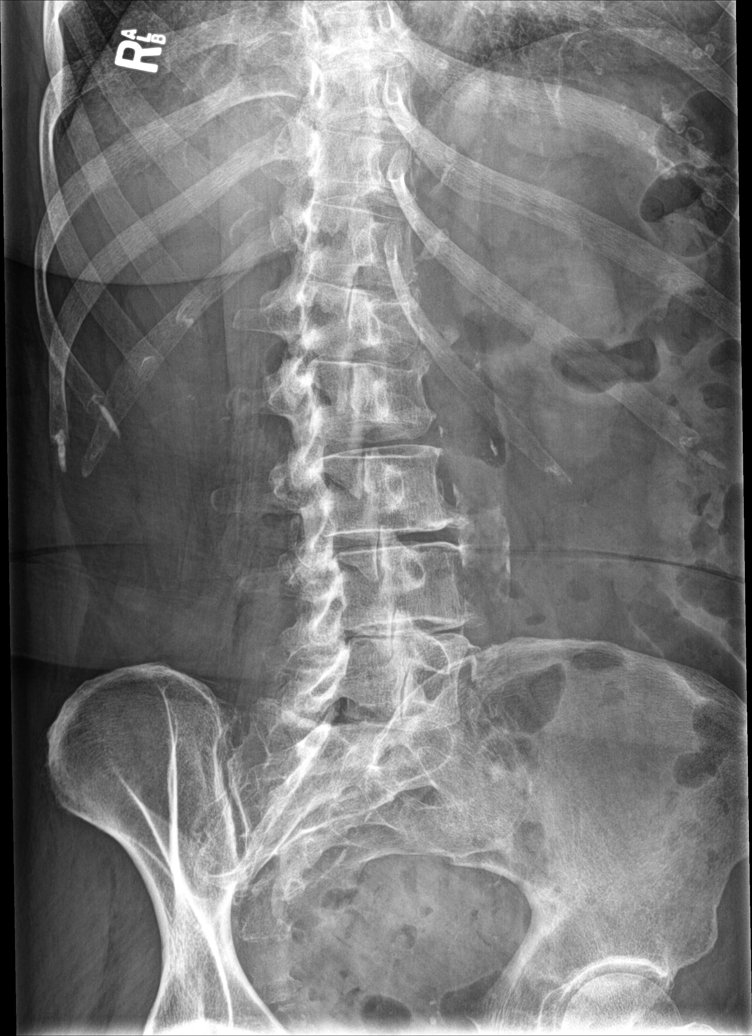

[l-spine lat]
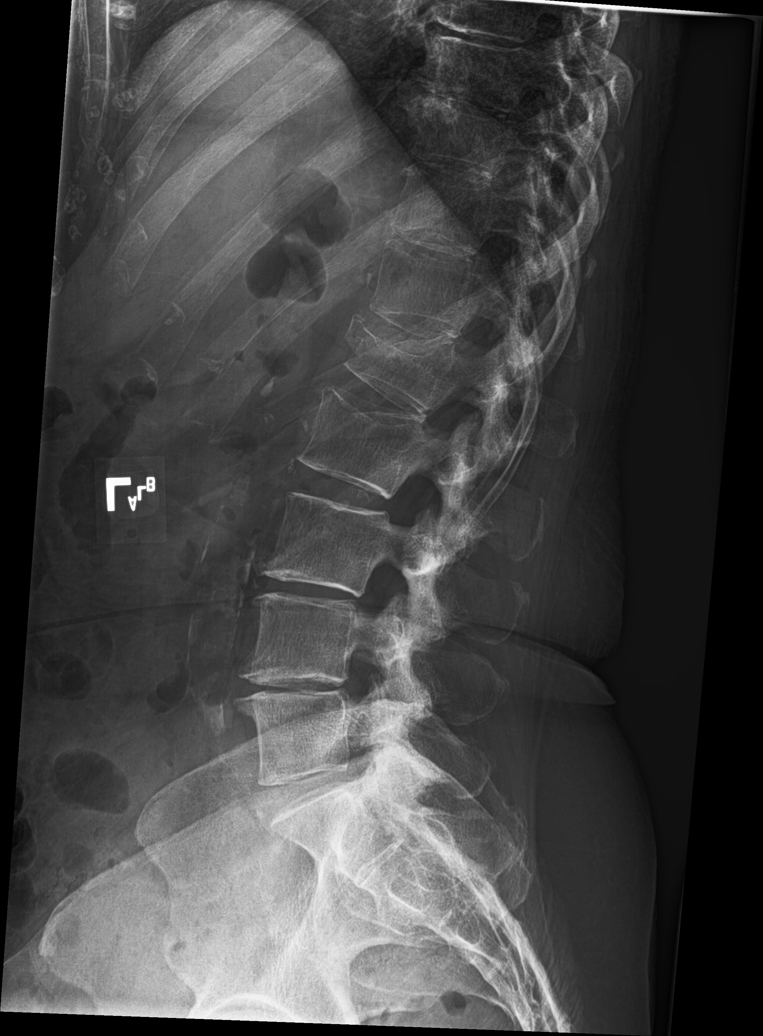

[l-spine spot]
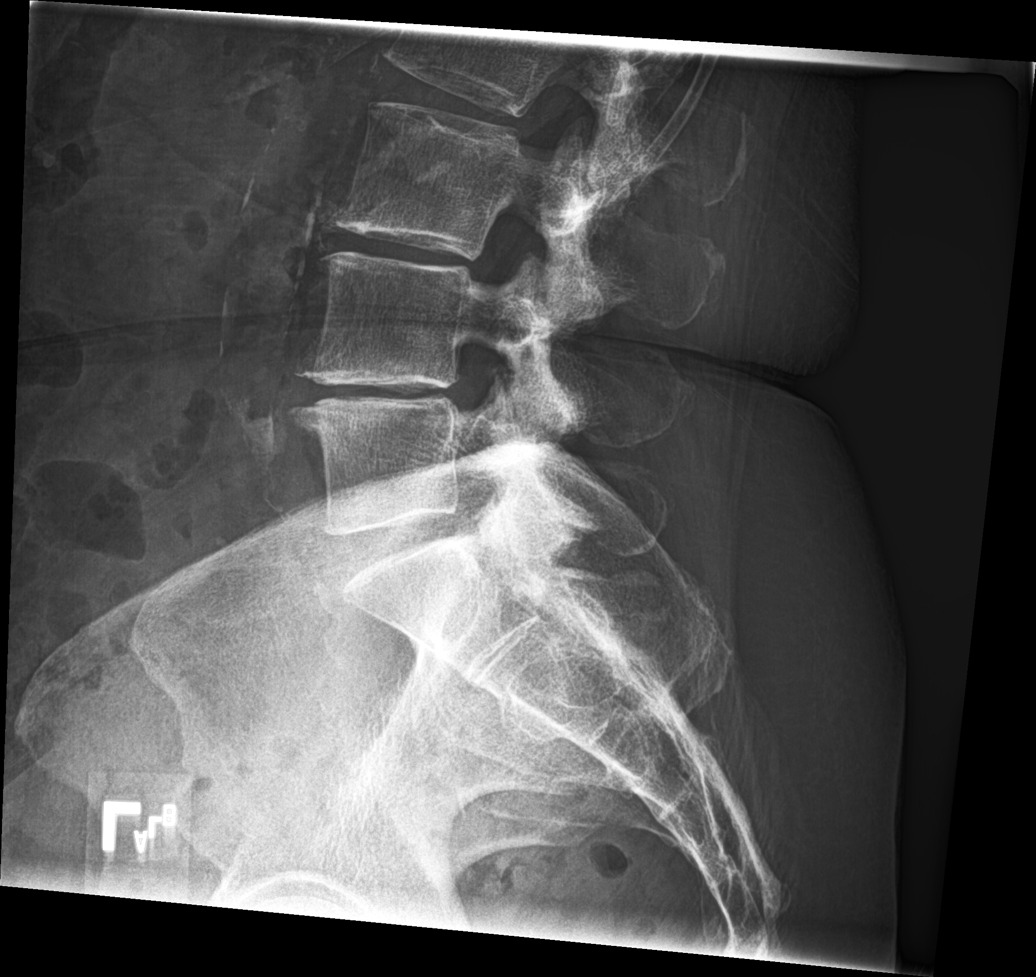

[5 of 5 positions shown; findings below may reference images not displayed]

FINDINGS: Mild scoliosis. At least moderate acute to subacute appearing
fracture involving the superior endplate at L1, also involves the
anterior aspect of vertebral body. Estimated 40% loss of vertebral
body height. Age indeterminate mild superior endplate deformity at
L2. Remaining vertebra demonstrate normal stature. Moderate disc
space narrowing and degenerative change at L3-L4 and L4-L5. Facet
degenerative changes at multiple levels. Aortic atherosclerosis
IMPRESSION: 1. Moderate acute to subacute appearing fracture at L1 involving
superior endplate and anterior aspect of vertebral body with
estimated 40% loss of vertebral body height
2. Mild age indeterminate superior endplate deformity at L2
3. Multilevel degenerative change

These results will be called to the ordering clinician or
representative by the Radiologist Assistant, and communication
documented in the PACS or [REDACTED].

## 2024-01-19 NOTE — Progress Notes (Unsigned)
 Date:  01/19/2024   Name:  Belinda Sanchez   DOB:  02-Jun-1940   MRN:  [id number]   Chief Complaint: No chief complaint on file.  HPI  Review of Systems   Lab Results  Component Value Date   NA 135 (A) 04/28/2023   K 3.9 04/28/2023   CO2 18 04/28/2023   GLUCOSE 96 05/05/2022   BUN 10 04/28/2023   CREATININE 0.8 04/28/2023   CALCIUM 10.2 05/05/2022   EGFR 79 04/28/2023   GFRNONAA 84 04/09/2020   Lab Results  Component Value Date   CHOL 261 (H) 05/05/2022   HDL 109 05/05/2022   LDLCALC 134 (H) 05/05/2022   TRIG 110 05/05/2022   CHOLHDL 2.4 05/05/2022   Lab Results  Component Value Date   TSH 3.730 10/31/2023   Lab Results  Component Value Date   HGBA1C 5.3 04/30/2021   Lab Results  Component Value Date   WBC 9.6 04/28/2023   HGB 14.8 04/28/2023   HCT 42 04/28/2023   MCV 99 (H) 05/05/2022   PLT 162 04/28/2023   Lab Results  Component Value Date   ALT 15 04/28/2023   AST 18 04/28/2023   ALKPHOS 70 04/28/2023   BILITOT 0.7 05/05/2022   Lab Results  Component Value Date   VD25OH 63.4 03/19/2019     Patient Active Problem List   Diagnosis Date Noted   Slow transit constipation 10/31/2023   Diverticulitis of sigmoid colon 09/15/2023   GERD without esophagitis 08/01/2022   Compression fracture of L1 lumbar vertebra (HCC) 09/30/2021   Osteopenia 11/23/2020   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) 06/11/2020   Hepatic steatosis 08/24/2018   Elevated LFTs 08/21/2018   Rotator cuff tendonitis, right 08/20/2018   Arthritis of foot, left 12/20/2016   Inflammatory spondylopathy of lumbar region 08/08/2015   Herpes simplex infection 12/15/2014   Acquired hypothyroidism 12/03/2014   Crohn's colitis, without complications (HCC) 12/03/2014   Dyslipidemia 12/03/2014   Essential (primary) hypertension 12/03/2014   Idiopathic insomnia 12/03/2014   Osteoarthritis of right knee 12/03/2014    Avitaminosis D 12/03/2014   History of breast cancer 06/27/2014    Allergies  Allergen Reactions   Clindamycin Diarrhea and Other (See Comments)    Abdominal pain   Tizanidine  Other (See Comments)    Dizziness, dry mouth   Lozol  [Indapamide]     elevated serum calcium   Sulfa Antibiotics Other (See Comments)    Reports excess bleeding. Advised not to take it by GI per patient Reports excess bleeding. Advised not to take it by GI per patient    Loratadine Palpitations    Past Surgical History:  Procedure Laterality Date   APPENDECTOMY     BREAST EXCISIONAL BIOPSY Right 04/2014   benign   BREAST SURGERY     COLONOSCOPY  08/2010   one polyp - repeat 5 yrs   EYE SURGERY Right 04/02/2019   cataract    JOINT REPLACEMENT  June 2021   MASTECTOMY, RADICAL Left 2009   SHOULDER SURGERY  2016   AVN   TOTAL SHOULDER REPLACEMENT Right 09/2019   VAGINAL HYSTERECTOMY     total    Social History   Tobacco Use   Smoking status: Never   Smokeless tobacco: Never  Vaping Use   Vaping status: Never Used  Substance Use Topics   Alcohol use: Yes    Alcohol/week: 16.0 standard drinks of alcohol  Types: 2 Standard drinks or equivalent, 14 Glasses of wine per week   Drug use: No     Medication list has been reviewed and updated.  No outpatient medications have been marked as taking for the 01/19/24 encounter (Orders Only) with Justus Leita DEL, MD.       10/31/2023    8:29 AM 08/15/2023    8:52 AM 05/08/2023    9:57 AM 03/03/2023   10:20 AM  GAD 7 : Generalized Anxiety Score  Nervous, Anxious, on Edge 0 0 0 3  Control/stop worrying 0 0 1 2  Worry too much - different things 0 0 0 0  Trouble relaxing 0 0 0 1  Restless 0 0 0 0  Easily annoyed or irritable 0 0 0 0  Afraid - awful might happen 0 0 1 0  Total GAD 7 Score 0 0 2 6  Anxiety Difficulty Not difficult at all Not difficult at all Not difficult at all Not difficult at all       10/31/2023    8:29 AM 09/15/2023    10:59 AM 08/15/2023    8:52 AM  Depression screen PHQ 2/9  Decreased Interest 0 0 0  Down, Depressed, Hopeless 0 0 0  PHQ - 2 Score 0 0 0  Altered sleeping   0  Tired, decreased energy   0  Change in appetite   0  Feeling bad or failure about yourself    0  Trouble concentrating   0  Moving slowly or fidgety/restless   0  Suicidal thoughts   0  PHQ-9 Score   0   Difficult doing work/chores   Not difficult at all     Data saved with a previous flowsheet row definition    BP Readings from Last 3 Encounters:  10/31/23 122/72  09/15/23 116/60  08/15/23 118/72    Physical Exam  Wt Readings from Last 3 Encounters:  10/31/23 146 lb (66.2 kg)  09/15/23 144 lb 6.4 oz (65.5 kg)  08/15/23 147 lb (66.7 kg)    There were no vitals taken for this visit.  Assessment and Plan:  Problem List Items Addressed This Visit   None   No follow-ups on file.    Leita HILARIO Justus, MD [facility] [place]

## 2024-01-20 ENCOUNTER — Other Ambulatory Visit: Payer: Self-pay | Admitting: Internal Medicine

## 2024-01-23 NOTE — Telephone Encounter (Signed)
 Requested Prescriptions  Pending Prescriptions Disp Refills   TIADYLT  ER 240 MG 24 hr capsule [Pharmacy Med Name: TIADYLT  ER 240 MG CAPSULE] 90 capsule 0    Sig: TAKE 1 CAPSULE BY MOUTH EVERY DAY     Cardiovascular: Calcium Channel Blockers 3 Passed - 01/23/2024 11:26 AM      Passed - ALT in normal range and within 360 days    ALT  Date Value Ref Range Status  04/28/2023 15 7 - 35 U/L Final         Passed - AST in normal range and within 360 days    AST  Date Value Ref Range Status  04/28/2023 18 13 - 35 Final         Passed - Cr in normal range and within 360 days    Creatinine  Date Value Ref Range Status  04/28/2023 0.8 0.5 - 1.1 Final   Creatinine, Ser  Date Value Ref Range Status  05/05/2022 0.91 0.57 - 1.00 mg/dL Final         Passed - Last BP in normal range    BP Readings from Last 1 Encounters:  10/31/23 122/72         Passed - Last Heart Rate in normal range    Pulse Readings from Last 1 Encounters:  10/31/23 (!) 59         Passed - Valid encounter within last 6 months    Recent Outpatient Visits           2 months ago Essential (primary) hypertension   [place] [facility] at [facility], Leita DEL, MD   4 months ago Diverticulitis of sigmoid colon   [facility] at [facility] [place] [facility], Selinda PARAS, MD   5 months ago Diverticulitis large intestine w/o perforation or abscess w/bleeding   [facility] at [facility], Leita DEL, MD   8 months ago Essential (primary) hypertension   [facility] at [facility], Leita DEL, MD

## 2024-03-11 ENCOUNTER — Encounter: Payer: Self-pay | Admitting: Student

## 2024-03-12 ENCOUNTER — Other Ambulatory Visit: Payer: Self-pay

## 2024-03-12 DIAGNOSIS — E039 Hypothyroidism, unspecified: Secondary | ICD-10-CM

## 2024-03-12 MED ORDER — LEVOTHYROXINE SODIUM 75 MCG PO TABS: 75.0000 ug | ORAL_TABLET | Freq: Every day | ORAL | 0 refills | Status: AC

## 2024-03-14 NOTE — Telephone Encounter (Signed)
 Spoke to pharmacist at [facility]. They would like to get a verbal okay to switch manufacturers for the thyroid  medication. From  [employer] (switch is what patient has been doing all along) to [facility].

## 2024-03-19 ENCOUNTER — Telehealth: Payer: Self-pay

## 2024-04-12 ENCOUNTER — Encounter: Admitting: Physician Assistant

## 2024-05-08 ENCOUNTER — Encounter: Admitting: Student
